# Patient Record
Sex: Male | Born: 1940 | Race: White | Hispanic: No | Marital: Single | Smoking: Former smoker
Health system: Southern US, Community
[De-identification: ages and names within clinical notes are randomized; demographics above are authoritative.]

## PROBLEM LIST (undated history)

## (undated) DIAGNOSIS — C61 Malignant neoplasm of prostate: Secondary | ICD-10-CM

## (undated) DIAGNOSIS — K5730 Diverticulosis of large intestine without perforation or abscess without bleeding: Secondary | ICD-10-CM

## (undated) DIAGNOSIS — I10 Essential (primary) hypertension: Secondary | ICD-10-CM

## (undated) DIAGNOSIS — N138 Other obstructive and reflux uropathy: Secondary | ICD-10-CM

## (undated) DIAGNOSIS — M1990 Unspecified osteoarthritis, unspecified site: Secondary | ICD-10-CM

## (undated) DIAGNOSIS — N401 Enlarged prostate with lower urinary tract symptoms: Secondary | ICD-10-CM

## (undated) HISTORY — PX: TONSILLECTOMY: SUR1361

## (undated) HISTORY — PX: PROSTATE BIOPSY: SHX241

---

## 1964-04-05 HISTORY — PX: OTHER SURGICAL HISTORY: SHX169

## 2006-03-08 ENCOUNTER — Ambulatory Visit: Payer: Self-pay | Admitting: Gastroenterology

## 2006-04-08 ENCOUNTER — Ambulatory Visit: Payer: Self-pay | Admitting: Gastroenterology

## 2006-04-08 HISTORY — PX: COLONOSCOPY: SHX174

## 2010-08-07 ENCOUNTER — Encounter (HOSPITAL_COMMUNITY)
Admission: RE | Admit: 2010-08-07 | Discharge: 2010-08-07 | Disposition: A | Discharge status: Home / Self Care | Payer: Medicare Other | Source: Ambulatory Visit | Attending: Orthopedic Surgery | Admitting: Orthopedic Surgery

## 2010-08-07 ENCOUNTER — Other Ambulatory Visit (HOSPITAL_COMMUNITY): Payer: Self-pay | Admitting: Orthopedic Surgery

## 2010-08-07 ENCOUNTER — Ambulatory Visit (HOSPITAL_COMMUNITY)
Admission: RE | Admit: 2010-08-07 | Discharge: 2010-08-07 | Disposition: A | Discharge status: Home / Self Care | Payer: Medicare Other | Source: Ambulatory Visit | Attending: Orthopedic Surgery | Admitting: Orthopedic Surgery

## 2010-08-07 DIAGNOSIS — M1712 Unilateral primary osteoarthritis, left knee: Secondary | ICD-10-CM

## 2010-08-07 DIAGNOSIS — Z01818 Encounter for other preprocedural examination: Secondary | ICD-10-CM | POA: Insufficient documentation

## 2010-08-07 DIAGNOSIS — J449 Chronic obstructive pulmonary disease, unspecified: Secondary | ICD-10-CM | POA: Insufficient documentation

## 2010-08-07 DIAGNOSIS — Z01812 Encounter for preprocedural laboratory examination: Secondary | ICD-10-CM | POA: Insufficient documentation

## 2010-08-07 DIAGNOSIS — J4489 Other specified chronic obstructive pulmonary disease: Secondary | ICD-10-CM | POA: Insufficient documentation

## 2010-08-07 DIAGNOSIS — Z01810 Encounter for preprocedural cardiovascular examination: Secondary | ICD-10-CM | POA: Insufficient documentation

## 2010-08-07 LAB — CBC
MCHC: 34.9 g/dL (ref 30.0–36.0)
Platelets: 310 10*3/uL (ref 150–400)
RDW: 14 % (ref 11.5–15.5)

## 2010-08-07 LAB — PROTIME-INR: Prothrombin Time: 12.8 seconds (ref 11.6–15.2)

## 2010-08-07 LAB — COMPREHENSIVE METABOLIC PANEL
Alkaline Phosphatase: 67 U/L (ref 39–117)
BUN: 11 mg/dL (ref 6–23)
CO2: 30 mEq/L (ref 19–32)
Chloride: 100 mEq/L (ref 96–112)
Creatinine, Ser: 0.54 mg/dL (ref 0.4–1.5)
GFR calc non Af Amer: >60 mL/min (ref >60)
Potassium: 3.9 mEq/L (ref 3.5–5.1)
Total Bilirubin: 1 mg/dL (ref 0.3–1.2)

## 2010-08-07 LAB — URINALYSIS, ROUTINE W REFLEX MICROSCOPIC
Protein, ur: NEGATIVE mg/dL
Urobilinogen, UA: 0.2 mg/dL (ref 0.0–1.0)

## 2010-08-07 LAB — TYPE AND SCREEN: ABO/RH(D): A POS

## 2010-08-07 LAB — SURGICAL PCR SCREEN: MRSA, PCR: NEGATIVE

## 2010-08-12 ENCOUNTER — Inpatient Hospital Stay (HOSPITAL_COMMUNITY)
Admission: RE | Admit: 2010-08-12 | Discharge: 2010-08-14 | DRG: 470 | Disposition: A | Discharge status: Home Health | Payer: Medicare Other | Source: Ambulatory Visit | Attending: Orthopedic Surgery | Admitting: Orthopedic Surgery

## 2010-08-12 ENCOUNTER — Inpatient Hospital Stay (HOSPITAL_COMMUNITY): Payer: Medicare Other

## 2010-08-12 DIAGNOSIS — M1710 Unilateral primary osteoarthritis, unspecified knee: Principal | ICD-10-CM | POA: Diagnosis present

## 2010-08-12 DIAGNOSIS — IMO0002 Reserved for concepts with insufficient information to code with codable children: Principal | ICD-10-CM | POA: Diagnosis present

## 2010-08-12 DIAGNOSIS — I10 Essential (primary) hypertension: Secondary | ICD-10-CM | POA: Diagnosis present

## 2010-08-12 DIAGNOSIS — F17200 Nicotine dependence, unspecified, uncomplicated: Secondary | ICD-10-CM | POA: Diagnosis present

## 2010-08-12 HISTORY — PX: TOTAL KNEE ARTHROPLASTY: SHX125

## 2010-08-13 LAB — CBC
HCT: 33.7 % — ABNORMAL LOW (ref 39.0–52.0)
Hemoglobin: 11.4 g/dL — ABNORMAL LOW (ref 13.0–17.0)
MCHC: 33.8 g/dL (ref 30.0–36.0)
MCV: 87.8 fL (ref 78.0–100.0)
RDW: 13.7 % (ref 11.5–15.5)
WBC: 11.5 10*3/uL — ABNORMAL HIGH (ref 4.0–10.5)

## 2010-08-13 LAB — PROTIME-INR: INR: 1.01 (ref 0.00–1.49)

## 2010-08-13 LAB — BASIC METABOLIC PANEL
CO2: 28 mEq/L (ref 19–32)
Chloride: 104 mEq/L (ref 96–112)
Creatinine, Ser: 0.53 mg/dL (ref 0.4–1.5)
GFR calc Af Amer: >60 mL/min (ref >60)
Potassium: 3.8 mEq/L (ref 3.5–5.1)

## 2010-08-14 LAB — CBC
HCT: 32.1 % — ABNORMAL LOW (ref 39.0–52.0)
MCH: 30.1 pg (ref 26.0–34.0)
MCV: 87.7 fL (ref 78.0–100.0)
Platelets: 274 10*3/uL (ref 150–400)
RBC: 3.66 MIL/uL — ABNORMAL LOW (ref 4.22–5.81)
WBC: 9.1 10*3/uL (ref 4.0–10.5)

## 2010-08-14 LAB — BASIC METABOLIC PANEL WITH GFR
BUN: 9 mg/dL (ref 6–23)
CO2: 32 meq/L (ref 19–32)
Calcium: 8.7 mg/dL (ref 8.4–10.5)
Chloride: 99 meq/L (ref 96–112)
Creatinine, Ser: 0.5 mg/dL (ref 0.4–1.5)
GFR calc non Af Amer: >60 mL/min
Glucose, Bld: 100 mg/dL — ABNORMAL HIGH (ref 70–99)
Potassium: 3.5 meq/L (ref 3.5–5.1)
Sodium: 137 meq/L (ref 135–145)

## 2010-08-14 LAB — PROTIME-INR
INR: 1.82 — ABNORMAL HIGH (ref 0.00–1.49)
Prothrombin Time: 21.2 s — ABNORMAL HIGH (ref 11.6–15.2)

## 2010-08-30 NOTE — Op Note (Signed)
NAME:  [facility], ED                    ACCOUNT NO.:  [id number]  MEDICAL RECORD NO.:  [id number]           PATIENT TYPE:  I  LOCATION:  5001                         FACILITY:  [facility]  PHYSICIAN:  Loreta Ave, M.D. DATE OF BIRTH:  [birth year]  DATE OF PROCEDURE:  08/12/2010 DATE OF DISCHARGE:                              OPERATIVE REPORT   PREOPERATIVE DIAGNOSIS:  End-stage degenerative arthritis of left knee, varus alignment.  POSTOPERATIVE DIAGNOSIS:  End-stage degenerative arthritis of left knee varus alignment.  PROCEDURE:  Left total knee replacement modified minimally invasive with Stryker triathlon prosthesis.  Soft tissue balancing, medial capsule release.  Cemented pegged posterior stabilized #6 femoral component. Cemented #6 tibial component 11-mm insert.  Resurfacing cemented 38-mm patellar component.  SURGEON:  Loreta Ave, MD  ASSISTANT:  Genene Churn. Barry Dienes, [place], present throughout the entire case and necessary for timely completion of the procedure.  ANESTHESIA:  General.  BLOOD LOSS:  Minimal.  SPECIMENS:  None.  CULTURES:  None.  COMPLICATIONS:  None.  DRESSINGS:  Soft compressive knee immobilizer.  DRAIN:  Hemovac x1.  TOURNIQUET TIME:  One hour ten minutes.  PROCEDURE:  The patient was brought to the operating room, placed on the operative table in supine position.  After adequate anesthesia had been obtained, knee was examined.  Varus alignment, partially correctable, still full extension, good flexion.  Tourniquet applied, prepped and draped in usual sterile fashion.  Exsanguinated with elevation and Esmarch, tourniquet inflated to 350 mmHg.  Straight incision above the patella down to tibial tubercle.  Medial arthrotomy, vastus splitting. Medial capsule release.  Knee exposed.  Grade 4 changes medially with still significant changes in the other compartments.  Remnants of menisci, cruciate ligaments, loose body, spurs removed.   Intramedullary guide on the femur.  A 10-mm resection, 5 degrees of valgus.  Using epicondylar axis, femur was sized, cut, and fitted for posterior stabilized pegged #6 component.  Extramedullary guide on the tibia.  A 3- degree posterior slope cut below the defect medially.  Size #6 component.  Recess examined to be sure all debris cleared throughout. Trials put in place.  A #6 above and below 11-mm insert.  Patella exposed, posterior 10 mm removed and drilled, sized, and fitted for a 38- mm component.  With all trials in place, nicely balanced knee, flexion and extension.  Good alignment, good stability, good tracking confirmed. Tibia was marked for rotation and hand reamed.  All trials were removed. Copious irrigation with pulse irrigating device.  Cement prepared and placed on all components, firmly seated.  Patella held with a clamp. Knee reduced to the polyethylene on the tibia.  Once the cement hardened, I reexamined it.  Again pleased with mechanical axis, alignment, stability, and tracking.  Wound irrigated.  Hemovac placed. Arthrotomy closed with #1 Vicryl.  Skin and subcutaneous tissue with Vicryl and staples.  Sterile compressive dressing applied.  Tourniquet was inflated and removed.  Knee immobilizer applied.  Anesthesia reversed.  Brought to recovery room.  Tolerated the surgery well.  No complications.     Loreta Ave, M.D.  DFM/MEDQ  D:  08/13/2010  T:  08/13/2010  Job:  [id number]  Electronically Signed by Mckinley Jewel M.D. on 08/30/2010 11:37:53 AM

## 2012-01-31 DIAGNOSIS — Z23 Encounter for immunization: Secondary | ICD-10-CM | POA: Diagnosis not present

## 2013-01-12 DIAGNOSIS — Z23 Encounter for immunization: Secondary | ICD-10-CM | POA: Diagnosis not present

## 2014-05-03 DIAGNOSIS — R972 Elevated prostate specific antigen [PSA]: Secondary | ICD-10-CM | POA: Diagnosis not present

## 2014-05-03 DIAGNOSIS — R350 Frequency of micturition: Secondary | ICD-10-CM | POA: Diagnosis not present

## 2014-05-03 DIAGNOSIS — N138 Other obstructive and reflux uropathy: Secondary | ICD-10-CM | POA: Diagnosis not present

## 2014-05-13 DIAGNOSIS — I10 Essential (primary) hypertension: Secondary | ICD-10-CM | POA: Diagnosis not present

## 2014-05-13 DIAGNOSIS — J069 Acute upper respiratory infection, unspecified: Secondary | ICD-10-CM | POA: Diagnosis not present

## 2014-05-13 DIAGNOSIS — N400 Enlarged prostate without lower urinary tract symptoms: Secondary | ICD-10-CM | POA: Diagnosis not present

## 2014-05-21 DIAGNOSIS — R972 Elevated prostate specific antigen [PSA]: Secondary | ICD-10-CM | POA: Diagnosis not present

## 2014-06-05 DIAGNOSIS — N401 Enlarged prostate with lower urinary tract symptoms: Secondary | ICD-10-CM | POA: Diagnosis not present

## 2014-06-05 DIAGNOSIS — R351 Nocturia: Secondary | ICD-10-CM | POA: Diagnosis not present

## 2014-06-05 DIAGNOSIS — R972 Elevated prostate specific antigen [PSA]: Secondary | ICD-10-CM | POA: Diagnosis not present

## 2014-06-05 DIAGNOSIS — R350 Frequency of micturition: Secondary | ICD-10-CM | POA: Diagnosis not present

## 2014-07-08 ENCOUNTER — Ambulatory Visit
Admission: RE | Admit: 2014-07-08 | Discharge: 2014-07-08 | Disposition: A | Discharge status: Home / Self Care | Payer: Medicare Other | Source: Ambulatory Visit | Attending: Geriatric Medicine | Admitting: Geriatric Medicine

## 2014-07-08 ENCOUNTER — Other Ambulatory Visit: Payer: Self-pay | Admitting: Geriatric Medicine

## 2014-07-08 DIAGNOSIS — R05 Cough: Secondary | ICD-10-CM

## 2014-07-08 DIAGNOSIS — I10 Essential (primary) hypertension: Secondary | ICD-10-CM | POA: Diagnosis not present

## 2014-07-08 DIAGNOSIS — J301 Allergic rhinitis due to pollen: Secondary | ICD-10-CM | POA: Diagnosis not present

## 2014-07-08 DIAGNOSIS — H9190 Unspecified hearing loss, unspecified ear: Secondary | ICD-10-CM | POA: Diagnosis not present

## 2014-07-08 DIAGNOSIS — R059 Cough, unspecified: Secondary | ICD-10-CM

## 2014-07-25 DIAGNOSIS — H908 Mixed conductive and sensorineural hearing loss, unspecified: Secondary | ICD-10-CM | POA: Diagnosis not present

## 2014-08-19 DIAGNOSIS — I499 Cardiac arrhythmia, unspecified: Secondary | ICD-10-CM | POA: Diagnosis not present

## 2014-08-19 DIAGNOSIS — Z79899 Other long term (current) drug therapy: Secondary | ICD-10-CM | POA: Diagnosis not present

## 2014-08-19 DIAGNOSIS — I10 Essential (primary) hypertension: Secondary | ICD-10-CM | POA: Diagnosis not present

## 2015-02-07 DIAGNOSIS — Z23 Encounter for immunization: Secondary | ICD-10-CM | POA: Diagnosis not present

## 2015-07-07 DIAGNOSIS — Z0000 Encounter for general adult medical examination without abnormal findings: Secondary | ICD-10-CM | POA: Diagnosis not present

## 2015-07-07 DIAGNOSIS — I10 Essential (primary) hypertension: Secondary | ICD-10-CM | POA: Diagnosis not present

## 2015-07-07 DIAGNOSIS — Z1389 Encounter for screening for other disorder: Secondary | ICD-10-CM | POA: Diagnosis not present

## 2015-07-07 DIAGNOSIS — Z125 Encounter for screening for malignant neoplasm of prostate: Secondary | ICD-10-CM | POA: Diagnosis not present

## 2015-07-07 DIAGNOSIS — Z79899 Other long term (current) drug therapy: Secondary | ICD-10-CM | POA: Diagnosis not present

## 2015-09-22 DIAGNOSIS — R351 Nocturia: Secondary | ICD-10-CM | POA: Diagnosis not present

## 2015-09-22 DIAGNOSIS — R972 Elevated prostate specific antigen [PSA]: Secondary | ICD-10-CM | POA: Diagnosis not present

## 2015-09-22 DIAGNOSIS — N401 Enlarged prostate with lower urinary tract symptoms: Secondary | ICD-10-CM | POA: Diagnosis not present

## 2015-09-23 ENCOUNTER — Other Ambulatory Visit (HOSPITAL_COMMUNITY): Payer: Self-pay | Admitting: Urology

## 2015-09-23 DIAGNOSIS — R972 Elevated prostate specific antigen [PSA]: Secondary | ICD-10-CM

## 2015-09-26 DIAGNOSIS — M1711 Unilateral primary osteoarthritis, right knee: Secondary | ICD-10-CM | POA: Diagnosis not present

## 2015-10-02 DIAGNOSIS — M1711 Unilateral primary osteoarthritis, right knee: Secondary | ICD-10-CM | POA: Diagnosis not present

## 2015-10-10 DIAGNOSIS — M1711 Unilateral primary osteoarthritis, right knee: Secondary | ICD-10-CM | POA: Diagnosis not present

## 2015-10-14 ENCOUNTER — Ambulatory Visit (HOSPITAL_COMMUNITY)
Admission: RE | Admit: 2015-10-14 | Discharge: 2015-10-14 | Disposition: A | Discharge status: Home / Self Care | Payer: Medicare Other | Source: Ambulatory Visit | Attending: Urology | Admitting: Urology

## 2015-10-14 DIAGNOSIS — R972 Elevated prostate specific antigen [PSA]: Secondary | ICD-10-CM | POA: Diagnosis not present

## 2015-10-14 DIAGNOSIS — R938 Abnormal findings on diagnostic imaging of other specified body structures: Secondary | ICD-10-CM | POA: Diagnosis not present

## 2015-10-14 LAB — POCT I-STAT CREATININE: CREATININE: 0.6 mg/dL — AB (ref 0.61–1.24)

## 2015-10-14 MED ORDER — GADOBENATE DIMEGLUMINE 529 MG/ML IV SOLN
15.0000 mL | Freq: Once | INTRAVENOUS | Status: AC | PRN
  Administered 2015-10-14: 15 mL via INTRAVENOUS

## 2015-11-12 DIAGNOSIS — R972 Elevated prostate specific antigen [PSA]: Secondary | ICD-10-CM | POA: Diagnosis not present

## 2015-11-12 DIAGNOSIS — C61 Malignant neoplasm of prostate: Secondary | ICD-10-CM | POA: Diagnosis not present

## 2015-11-18 DIAGNOSIS — C61 Malignant neoplasm of prostate: Secondary | ICD-10-CM | POA: Diagnosis not present

## 2015-12-10 ENCOUNTER — Encounter: Payer: Self-pay | Admitting: Radiation Oncology

## 2015-12-10 NOTE — Progress Notes (Signed)
GU Location of Tumor / Histology: prostatic adenocarcinoma  If Prostate Cancer, Gleason Score is (3 + 3) and PSA is (7.34)  Andrew Day    Biopsies of prostate (if applicable) revealed:    Past/Anticipated interventions by urology, if any: prostate biopsy, referral to radiation oncology to discuss I-125 seed therapy   Past/Anticipated interventions by medical oncology, if any: no  Weight changes, if any: no  Bowel/Bladder complaints, if any: nocturia, frequency, and intermittent stream   Nausea/Vomiting, if any: no  Pain issues, if any:  no  SAFETY ISSUES:  Prior radiation? no  Pacemaker/ICD? no  Possible current pregnancy? no  Is the patient on methotrexate? no  Current Complaints / other details:  75 year old male. Married with two sons and one daughter. Retired. Patient most interested in seeds. Prostate volume 58.09 cc.

## 2015-12-15 ENCOUNTER — Ambulatory Visit
Admission: RE | Admit: 2015-12-15 | Discharge: 2015-12-15 | Disposition: A | Discharge status: Home / Self Care | Payer: Medicare Other | Source: Ambulatory Visit | Attending: Radiation Oncology | Admitting: Radiation Oncology

## 2015-12-15 ENCOUNTER — Encounter: Payer: Self-pay | Admitting: Radiation Oncology

## 2015-12-15 DIAGNOSIS — C61 Malignant neoplasm of prostate: Secondary | ICD-10-CM

## 2015-12-15 HISTORY — DX: Malignant neoplasm of prostate: C61

## 2015-12-15 HISTORY — DX: Essential (primary) hypertension: I10

## 2015-12-15 NOTE — Progress Notes (Signed)
See progress note under physician encounter. 

## 2015-12-15 NOTE — Progress Notes (Signed)
Radiation Oncology         [phone] ________________________________  Initial Outpatient Consultation  Name: Andrew Day MRN: [id number]  Date: 12/15/2015  DOB: 1940-08-11  [id number] Marcello Moores, MD  Carolan Clines, MD   REFERRING PHYSICIAN: Carolan Clines, MD  DIAGNOSIS: The encounter diagnosis was Malignant neoplasm of prostate [facility]).    ICD-9-CM ICD-10-CM   1. Malignant neoplasm of prostate ([place]) 185 C61 PSA   75 y.o. gentleman with T1c adenocarcinoma of the prostate, Gleason Score 3+3 and PSA 7.34  HISTORY OF PRESENT ILLNESS: Andrew Day is a 75 y.o. male seen at the request of Dr. Gaynelle Arabian for a new diagnosis of prostate cancer. He was noted to have an elevated PSA by his primary care physician, Dr. Felipa Eth.  Accordingly, he was referred for evaluation in urology by Dr. Gaynelle Arabian,  digital rectal examination was performed at that time revealing no palpable nodules. He has been followed because of an elevated PSA in 2016. On 05/21/2014, the patient had a 12 core biopsy of the prostate revealing high-grade PIN in the left mid lateral. The patient continued to have persistent PSA elevation at 7.34 and proceeded to transrectal ultrasound with 12 biopsies of the prostate on 11/12/15.  The prostate volume measured 58 cc.  Out of 12 core biopsies, one was positive for adenocarcinoma.  The maximum Gleason score was 3+3, and this was seen in the right mid apex.  PSA History 12/2013: 4.29 02/2014: 4.44 05/2014: 8.96    The patient reviewed the biopsy results with his urologist and he has kindly been referred today for discussion of potential radiation treatment options. Also present during this encounter was the patient's wife.  PREVIOUS RADIATION THERAPY: No  PAST MEDICAL HISTORY:  Past Medical History:  Diagnosis Date  . Hypertension   . Prostate cancer ([place])       PAST SURGICAL HISTORY: Past Surgical History:  Procedure Laterality Date  . KNEE SURGERY    .  PROSTATE BIOPSY      FAMILY HISTORY:  Family History  Problem Relation Age of Onset  . Diabetes Father   . Cancer Daughter     sinus    SOCIAL HISTORY:  Social History   Social History  . Marital status: Single    Spouse name: N/A  . Number of children: N/A  . Years of education: N/A   Occupational History  . Not on file.   Social History Main Topics  . Smoking status: Light Tobacco Smoker    Packs/day: 0.00    Years: 5.00    Types: Cigars  . Smokeless tobacco: Never Used  . Alcohol use 3.0 oz/week    5 Glasses of wine per week     Comment: glass of wine each night  . Drug use: No  . Sexual activity: Yes   Other Topics Concern  . Not on file   Social History Narrative  . No narrative on file    ALLERGIES: Review of patient's allergies indicates no known allergies.  MEDICATIONS:  Current Outpatient Prescriptions  Medication Sig Dispense Refill  . AMLODIPINE BESYLATE PO Take 10 mg by mouth.    . Ascorbic Acid (VITAMIN C) 1000 MG tablet Take 1,000 mg by mouth daily.    Marland Kitchen aspirin 81 MG chewable tablet Chew by mouth daily.    . Calcium Carbonate-Vit D-Min (CALCIUM 1200 PO) Take by mouth.    . Cholecalciferol (VITAMIN D3) 1000 units CAPS Take by mouth.    . hydrochlorothiazide (HYDRODIURIL) 25 MG tablet  Take 25 mg by mouth daily.    Marland Kitchen lisinopril (PRINIVIL,ZESTRIL) 10 MG tablet Take 10 mg by mouth daily.    . Multiple Vitamins-Minerals (MULTIVITAMIN ADULT PO) Take by mouth.    . Omega-3 Fatty Acids (FISH OIL) 1000 MG CAPS Take by mouth. Every other day     No current facility-administered medications for this encounter.     REVIEW OF SYSTEMS:  On review of systems, the patient reports that he is doing well overall. He denies any chest pain, shortness of breath, cough, fevers, chills, night sweats, unintended weight changes. He denies any bowel disturbances, and denies abdominal pain, nausea or vomiting. He denies any new musculoskeletal or joint aches or pains.  IPSS: 9, indicating mild bladder symptoms. Symptoms include, incomplete emptying, frequency, urgency, and a weak stream. Moderate erectile dysfunction with a SHIM Score of 17. A complete review of systems is obtained and is otherwise negative.    PHYSICAL EXAM:  height is 5\' 11"  (1.803 m) and weight is 165 lb 8 oz (75.1 kg). His oral temperature is 98.1 F (36.7 C). His blood pressure is 120/74 and his pulse is 72. His respiration is 18 and oxygen saturation is 100%.   Pain Scale 0/10 In general this is a well appearing Caucasian male in no acute distress. He is alert and oriented x4 and appropriate throughout the examination. HEENT reveals that the patient is normocephalic, atraumatic. EOMs are intact. PERRLA. Skin is intact without any evidence of gross lesions. Cardiovascular exam reveals a regular rate and rhythm, no clicks rubs or murmurs are auscultated. Chest is clear to auscultation bilaterally. Lymphatic assessment is performed and does not reveal any adenopathy in the cervical, supraclavicular, axillary, or inguinal chains. Abdomen has active bowel sounds in all quadrants and is intact. The abdomen is soft, non tender, non distended. Lower extremities are negative for pretibial pitting edema, deep calf tenderness, cyanosis or clubbing.   KPS = 100  100 - Normal; no complaints; no evidence of disease. 90   - Able to carry on normal activity; minor signs or symptoms of disease. 80   - Normal activity with effort; some signs or symptoms of disease. 69   - Cares for self; unable to carry on normal activity or to do active work. 60   - Requires occasional assistance, but is able to care for most of his personal needs. 50   - Requires considerable assistance and frequent medical care. 99   - Disabled; requires special care and assistance. 101   - Severely disabled; hospital admission is indicated although death not imminent. 41   - Very sick; hospital admission necessary; active supportive  treatment necessary. 10   - Moribund; fatal processes progressing rapidly. 0     - Dead  Karnofsky DA, Abelmann [place], Craver LS and Burchenal [facility] [phone]) The use of the nitrogen mustards in the palliative treatment of carcinoma: with particular reference to bronchogenic carcinoma Cancer 1 634-56  LABORATORY DATA:  Lab Results  Component Value Date   WBC 9.1 08/14/2010   HGB 11.0 (L) 08/14/2010   HCT 32.1 (L) 08/14/2010   MCV 87.7 08/14/2010   PLT 274 08/14/2010   Lab Results  Component Value Date   NA 137 08/14/2010   K 3.5 08/14/2010   CL 99 08/14/2010   CO2 32 08/14/2010   Lab Results  Component Value Date   ALT 17 08/07/2010   AST 18 08/07/2010   ALKPHOS 67 08/07/2010   BILITOT 1.0 08/07/2010  RADIOGRAPHY: No results found.    IMPRESSION/PLAN: 1. 76 y.o. gentleman with a low risk, T1c, adenocarcinoma of the prostate with maximum Gleason's Score of 3+3 in 1 core and a PSA of 7.34.  Accordingly he is eligible for a variety of potential treatment options including surveillance, prostatectomy, IMRT, or radioactive seed implant.  Today I reviewed the findings and workup thus far.  We discussed the natural history of prostate cancer.  We reviewed the the implications of T-stage, Gleason's Score, and PSA on decision-making and outcomes in prostate cancer.  We discussed radiation treatment in the management of prostate cancer with regard to the logistics and delivery of external beam radiation treatment as well as the logistics and delivery of prostate brachytherapy.  We compared and contrasted each of these approaches and also compared these against prostatectomy.  The patient expressed interest in prostate brachytherapy.  The patient would like to proceed with prostate brachytherapy.  I will share my findings with Dr. Gaynelle Arabian and move forward with scheduling the procedure in the near future.  Dr. Tammi Klippel discussed rechecking the patient's PSA as it has been more than 6  months.  I enjoyed meeting with him today, and will look forward to participating in the care of this very nice gentleman.  The above documentation reflects my direct findings during this shared patient visit. Please see the separate note by Dr. Tammi Klippel on this date for the remainder of the patient's plan of care.   Carola Rhine, PAC  This document serves as a record of services personally performed by Shona Simpson, PA-C and Dr. Tyler Pita, MD. It was created on their behalf by Darcus Austin, a trained medical scribe. The creation of this record is based on the scribe's personal observations and the providers' statements to them. This document has been checked and approved by the attending provider.      I have personally performed a face to face diagnostic evaluation on this patient and devised the assessment and plan.    Tyler Pita, MD [place] Director and Director of Stereotactic Radiosurgery Direct Dial: [phone]  Fax: [phone] [email or web address]  Skype  [email or web address]

## 2015-12-16 LAB — PSA: Prostate Specific Ag, Serum: 8.2 ng/mL — ABNORMAL HIGH (ref 0.0–4.0)

## 2015-12-17 ENCOUNTER — Telehealth: Payer: Self-pay | Admitting: Radiation Oncology

## 2015-12-17 NOTE — Telephone Encounter (Signed)
Phoned patient to inform him of his PSA results. Patient reports Dr. Johny Shears has already called. Patient aware PSA is 8.2 but, expressed appreciation for the call.

## 2015-12-17 NOTE — Telephone Encounter (Signed)
-----   Message from Hayden Pedro, [place] sent at 12/16/2015 12:14 PM EDT ----- Regarding: FW: PSA result Tonie- Would you mind letting the pt know his PSA was 8.2, and we will continue just as outlined with plans for his seed implant? Enid Derry will coordinate next week.  Thanks, Bryson Ha  ----- Message ----- From: Hollace Kinnier Sent: 12/16/2015  11:59 AM To: Hayden Pedro, PA-C Subject: FW: PSA result                                 I was asked to forward this to you on behalf of Sam. Thank you!-Lori ----- Message ----- From: Hollace Kinnier Sent: 12/16/2015  10:47 AM To: Heywood Footman, RN Subject: PSA result                                     Hi Sam,  Mr. Demont said that he doesn't see his PSA result on 'MyChart' and was really wanting to know what it turned out to be from yesterday's blood work. Would you be able to give him a call in regards to this result? [phone].  Thank you,  Cecille Rubin

## 2015-12-18 ENCOUNTER — Encounter: Payer: Self-pay | Admitting: *Deleted

## 2015-12-18 NOTE — Progress Notes (Signed)
  Radiation Oncology         [phone] ________________________________  Name: Andrew Day MRN: [id number]  Date: 12/19/2015  DOB: [birth year]  SIMULATION AND TREATMENT PLANNING NOTE PUBIC ARCH STUDY  [id number] Marcello Moores, MD  Carolan Clines, MD  DIAGNOSIS: 75 y.o. 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 Malignant neoplasm of prostate ([place]) [place]:  The patient presented today for evaluation for possible prostate seed implant. He was brought to the radiation planning suite and placed supine on the CT couch. A 3-dimensional image study set was obtained in upload to the planning computer. There, on each axial slice, I contoured the prostate gland. Then, using three-dimensional radiation planning tools I reconstructed the prostate in view of the structures from the transperineal needle pathway to assess for possible pubic arch interference. In doing so, I did not appreciate any pubic arch interference. Also, the patient's prostate volume was estimated based on the drawn structure. The volume was 58 cc.  Given the pubic arch appearance and prostate volume, patient remains a good candidate to proceed with prostate seed implant. Today, he freely provided informed written consent to proceed.    PLAN: The patient will undergo prostate seed implant.   ________________________________  Sheral Apley. Tammi Klippel, M.D.

## 2015-12-18 NOTE — Progress Notes (Signed)
Spoke with Andrew Day and gave him his PSA results and let him know he will continue with his seed implant plans as discussed with Dr. Tammi Klippel.

## 2015-12-19 ENCOUNTER — Ambulatory Visit
Admission: RE | Admit: 2015-12-19 | Discharge: 2015-12-19 | Disposition: A | Discharge status: Home / Self Care | Payer: Medicare Other | Source: Ambulatory Visit | Attending: Radiation Oncology | Admitting: Radiation Oncology

## 2015-12-19 DIAGNOSIS — C61 Malignant neoplasm of prostate: Secondary | ICD-10-CM | POA: Diagnosis not present

## 2015-12-22 ENCOUNTER — Telehealth: Payer: Self-pay | Admitting: *Deleted

## 2015-12-22 NOTE — Telephone Encounter (Signed)
Called patient to inform that I called [facility] to get him an appt. For gold seed placement, the scheduler that I spoke with told me that she needs to contact his nurse because Dr. Gaynelle Arabian is booked out, I spoke with Mr. Nicholes and I informed him of this, I told him as soon as they call me that I will call him

## 2015-12-26 ENCOUNTER — Other Ambulatory Visit: Payer: Self-pay | Admitting: Urology

## 2015-12-26 ENCOUNTER — Telehealth: Payer: Self-pay | Admitting: *Deleted

## 2015-12-26 NOTE — Telephone Encounter (Signed)
Called patient to inform of implant date, spoke with patient and he is aware of this implant date

## 2015-12-29 ENCOUNTER — Telehealth: Payer: Self-pay | Admitting: *Deleted

## 2015-12-29 NOTE — Telephone Encounter (Signed)
CALLED PATIENT TO ASK ABOUT GETTING HIS CHEST X-RAY AND EKG, LVM FOR A RETURN CALL

## 2015-12-30 ENCOUNTER — Encounter (HOSPITAL_BASED_OUTPATIENT_CLINIC_OR_DEPARTMENT_OTHER)
Admission: RE | Admit: 2015-12-30 | Discharge: 2015-12-30 | Disposition: A | Discharge status: Home / Self Care | Payer: Medicare Other | Source: Ambulatory Visit | Attending: Urology | Admitting: Urology

## 2015-12-30 ENCOUNTER — Ambulatory Visit (HOSPITAL_BASED_OUTPATIENT_CLINIC_OR_DEPARTMENT_OTHER)
Admission: RE | Admit: 2015-12-30 | Discharge: 2015-12-30 | Disposition: A | Discharge status: Home / Self Care | Payer: Medicare Other | Source: Ambulatory Visit | Attending: Urology | Admitting: Urology

## 2015-12-30 DIAGNOSIS — C61 Malignant neoplasm of prostate: Secondary | ICD-10-CM | POA: Diagnosis not present

## 2015-12-30 DIAGNOSIS — M47819 Spondylosis without myelopathy or radiculopathy, site unspecified: Secondary | ICD-10-CM | POA: Diagnosis not present

## 2016-01-05 DIAGNOSIS — N400 Enlarged prostate without lower urinary tract symptoms: Secondary | ICD-10-CM | POA: Diagnosis not present

## 2016-01-05 DIAGNOSIS — I10 Essential (primary) hypertension: Secondary | ICD-10-CM | POA: Diagnosis not present

## 2016-01-28 ENCOUNTER — Telehealth: Payer: Self-pay | Admitting: *Deleted

## 2016-01-28 ENCOUNTER — Ambulatory Visit: Admission: RE | Admit: 2016-01-28 | Payer: Medicare Other | Source: Ambulatory Visit

## 2016-01-28 NOTE — Telephone Encounter (Signed)
Called patient to remind of labs for 01-29-16 for implant on 02-05-16, lvm for a return call

## 2016-02-02 ENCOUNTER — Encounter (HOSPITAL_BASED_OUTPATIENT_CLINIC_OR_DEPARTMENT_OTHER): Payer: Self-pay | Admitting: *Deleted

## 2016-02-02 DIAGNOSIS — Z87891 Personal history of nicotine dependence: Secondary | ICD-10-CM | POA: Diagnosis not present

## 2016-02-02 DIAGNOSIS — N401 Enlarged prostate with lower urinary tract symptoms: Secondary | ICD-10-CM | POA: Diagnosis not present

## 2016-02-02 DIAGNOSIS — M1990 Unspecified osteoarthritis, unspecified site: Secondary | ICD-10-CM | POA: Diagnosis not present

## 2016-02-02 DIAGNOSIS — Z7982 Long term (current) use of aspirin: Secondary | ICD-10-CM | POA: Diagnosis not present

## 2016-02-02 DIAGNOSIS — C61 Malignant neoplasm of prostate: Secondary | ICD-10-CM | POA: Diagnosis not present

## 2016-02-02 DIAGNOSIS — I10 Essential (primary) hypertension: Secondary | ICD-10-CM | POA: Diagnosis not present

## 2016-02-02 DIAGNOSIS — N138 Other obstructive and reflux uropathy: Secondary | ICD-10-CM | POA: Diagnosis not present

## 2016-02-02 LAB — PROTIME-INR
INR: 0.96
Prothrombin Time: 12.8 seconds (ref 11.4–15.2)

## 2016-02-02 LAB — COMPREHENSIVE METABOLIC PANEL
ALBUMIN: 4.3 g/dL (ref 3.5–5.0)
ALK PHOS: 73 U/L (ref 38–126)
ALT: 18 U/L (ref 17–63)
ANION GAP: 7 (ref 5–15)
AST: 20 U/L (ref 15–41)
BUN: 15 mg/dL (ref 6–20)
CALCIUM: 9.2 mg/dL (ref 8.9–10.3)
CO2: 25 mmol/L (ref 22–32)
Chloride: 104 mmol/L (ref 101–111)
Creatinine, Ser: 0.64 mg/dL (ref 0.61–1.24)
GFR calc non Af Amer: >60 mL/min (ref >60)
GLUCOSE: 87 mg/dL (ref 65–99)
POTASSIUM: 4 mmol/L (ref 3.5–5.1)
SODIUM: 136 mmol/L (ref 135–145)
Total Bilirubin: 1.1 mg/dL (ref 0.3–1.2)
Total Protein: 7 g/dL (ref 6.5–8.1)

## 2016-02-02 LAB — APTT: APTT: 26 s (ref 24–36)

## 2016-02-02 LAB — CBC
HCT: 41 % (ref 39.0–52.0)
HEMOGLOBIN: 14 g/dL (ref 13.0–17.0)
MCH: 29.4 pg (ref 26.0–34.0)
MCHC: 34.1 g/dL (ref 30.0–36.0)
MCV: 86.1 fL (ref 78.0–100.0)
PLATELETS: 291 10*3/uL (ref 150–400)
RBC: 4.76 MIL/uL (ref 4.22–5.81)
RDW: 13.9 % (ref 11.5–15.5)
WBC: 7.2 10*3/uL (ref 4.0–10.5)

## 2016-02-02 NOTE — Progress Notes (Signed)
NPO AFTER MN.  ARRIVE AT 0800.  CURRENT LAB RESULTS, CXR, AND EKG IN CHART AND EPIC.  WILL TAKE NORVASC AM DOS W/ SIPS OF WATER AND DO FLEET ENEMA.

## 2016-02-02 NOTE — Progress Notes (Signed)
NPO AFTER MN.  ARRIVE AT 0800.

## 2016-02-04 ENCOUNTER — Telehealth: Payer: Self-pay | Admitting: *Deleted

## 2016-02-04 NOTE — Telephone Encounter (Signed)
CALLED PATIENT TO REMIND OF IMPLANT OF FOR 02-05-16, SPOKE WITH PATIENT AND HE IS AWARE OF THIS IMPLANT

## 2016-02-05 ENCOUNTER — Ambulatory Visit (HOSPITAL_COMMUNITY): Payer: Medicare Other

## 2016-02-05 ENCOUNTER — Ambulatory Visit (HOSPITAL_BASED_OUTPATIENT_CLINIC_OR_DEPARTMENT_OTHER)
Admission: RE | Admit: 2016-02-05 | Discharge: 2016-02-05 | Disposition: A | Discharge status: Home / Self Care | Payer: Medicare Other | Source: Ambulatory Visit | Attending: Urology | Admitting: Urology

## 2016-02-05 ENCOUNTER — Encounter (HOSPITAL_BASED_OUTPATIENT_CLINIC_OR_DEPARTMENT_OTHER)
Admission: RE | Disposition: A | Discharge status: Home / Self Care | Payer: Self-pay | Source: Ambulatory Visit | Attending: Urology

## 2016-02-05 ENCOUNTER — Ambulatory Visit (HOSPITAL_BASED_OUTPATIENT_CLINIC_OR_DEPARTMENT_OTHER): Payer: Medicare Other | Admitting: Anesthesiology

## 2016-02-05 ENCOUNTER — Encounter (HOSPITAL_BASED_OUTPATIENT_CLINIC_OR_DEPARTMENT_OTHER): Payer: Self-pay | Admitting: Anesthesiology

## 2016-02-05 DIAGNOSIS — N138 Other obstructive and reflux uropathy: Secondary | ICD-10-CM | POA: Diagnosis not present

## 2016-02-05 DIAGNOSIS — I10 Essential (primary) hypertension: Secondary | ICD-10-CM | POA: Insufficient documentation

## 2016-02-05 DIAGNOSIS — N401 Enlarged prostate with lower urinary tract symptoms: Secondary | ICD-10-CM | POA: Diagnosis not present

## 2016-02-05 DIAGNOSIS — Z87891 Personal history of nicotine dependence: Secondary | ICD-10-CM | POA: Insufficient documentation

## 2016-02-05 DIAGNOSIS — Z7982 Long term (current) use of aspirin: Secondary | ICD-10-CM | POA: Diagnosis not present

## 2016-02-05 DIAGNOSIS — M1990 Unspecified osteoarthritis, unspecified site: Secondary | ICD-10-CM | POA: Insufficient documentation

## 2016-02-05 DIAGNOSIS — C61 Malignant neoplasm of prostate: Secondary | ICD-10-CM | POA: Diagnosis not present

## 2016-02-05 HISTORY — DX: Diverticulosis of large intestine without perforation or abscess without bleeding: K57.30

## 2016-02-05 HISTORY — PX: RADIOACTIVE SEED IMPLANT: SHX5150

## 2016-02-05 HISTORY — DX: Benign prostatic hyperplasia with lower urinary tract symptoms: N13.8

## 2016-02-05 HISTORY — DX: Benign prostatic hyperplasia with lower urinary tract symptoms: N40.1

## 2016-02-05 HISTORY — DX: Unspecified osteoarthritis, unspecified site: M19.90

## 2016-02-05 SURGERY — INSERTION, RADIATION SOURCE, PROSTATE
Anesthesia: General | Site: Prostate

## 2016-02-05 MED ORDER — LIDOCAINE 2% (20 MG/ML) 5 ML SYRINGE
INTRAMUSCULAR | Status: DC | PRN
  Administered 2016-02-05: 40 mg via INTRAVENOUS

## 2016-02-05 MED ORDER — CIPROFLOXACIN IN D5W 400 MG/200ML IV SOLN
INTRAVENOUS | Status: AC
  Filled 2016-02-05: qty 200

## 2016-02-05 MED ORDER — EPHEDRINE SULFATE 50 MG/ML IJ SOLN
INTRAMUSCULAR | Status: DC | PRN
  Administered 2016-02-05: 5 mg via INTRAVENOUS
  Administered 2016-02-05 (×2): 10 mg via INTRAVENOUS

## 2016-02-05 MED ORDER — SODIUM CHLORIDE 0.9 % IR SOLN
Status: DC | PRN
  Administered 2016-02-05: 1000 mL via INTRAVESICAL

## 2016-02-05 MED ORDER — KETOROLAC TROMETHAMINE 30 MG/ML IJ SOLN
INTRAMUSCULAR | Status: AC
  Filled 2016-02-05: qty 1

## 2016-02-05 MED ORDER — ACETAMINOPHEN 10 MG/ML IV SOLN
INTRAVENOUS | Status: AC
  Filled 2016-02-05: qty 100

## 2016-02-05 MED ORDER — FENTANYL CITRATE (PF) 100 MCG/2ML IJ SOLN
INTRAMUSCULAR | Status: AC
  Filled 2016-02-05: qty 2

## 2016-02-05 MED ORDER — PROPOFOL 10 MG/ML IV BOLUS
INTRAVENOUS | Status: AC
  Filled 2016-02-05: qty 40

## 2016-02-05 MED ORDER — MIDAZOLAM HCL 2 MG/2ML IJ SOLN
INTRAMUSCULAR | Status: AC
  Filled 2016-02-05: qty 2

## 2016-02-05 MED ORDER — OXYCODONE HCL 5 MG PO TABS
5.0000 mg | ORAL_TABLET | Freq: Once | ORAL | Status: DC | PRN
  Filled 2016-02-05: qty 1

## 2016-02-05 MED ORDER — OXYCODONE-ACETAMINOPHEN 5-325 MG PO TABS: 1.0000 | ORAL_TABLET | Freq: Four times a day (QID) | ORAL | 0 refills | Status: DC | PRN

## 2016-02-05 MED ORDER — FLEET ENEMA 7-19 GM/118ML RE ENEM
1.0000 | ENEMA | Freq: Once | RECTAL | Status: DC
  Filled 2016-02-05: qty 1

## 2016-02-05 MED ORDER — ONDANSETRON HCL 4 MG/2ML IJ SOLN
INTRAMUSCULAR | Status: AC
  Filled 2016-02-05: qty 2

## 2016-02-05 MED ORDER — MIDAZOLAM HCL 5 MG/5ML IJ SOLN
INTRAMUSCULAR | Status: DC | PRN
  Administered 2016-02-05: 2 mg via INTRAVENOUS

## 2016-02-05 MED ORDER — DEXAMETHASONE SODIUM PHOSPHATE 10 MG/ML IJ SOLN
INTRAMUSCULAR | Status: DC | PRN
  Administered 2016-02-05: 10 mg via INTRAVENOUS

## 2016-02-05 MED ORDER — FENTANYL CITRATE (PF) 100 MCG/2ML IJ SOLN
INTRAMUSCULAR | Status: DC | PRN
  Administered 2016-02-05 (×4): 25 ug via INTRAVENOUS

## 2016-02-05 MED ORDER — ONDANSETRON HCL 4 MG/2ML IJ SOLN
INTRAMUSCULAR | Status: DC | PRN
  Administered 2016-02-05: 4 mg via INTRAVENOUS

## 2016-02-05 MED ORDER — LACTATED RINGERS IV SOLN
INTRAVENOUS | Status: DC
  Filled 2016-02-05: qty 1000

## 2016-02-05 MED ORDER — LIDOCAINE 2% (20 MG/ML) 5 ML SYRINGE
INTRAMUSCULAR | Status: AC
  Filled 2016-02-05: qty 5

## 2016-02-05 MED ORDER — MEPERIDINE HCL 25 MG/ML IJ SOLN
6.2500 mg | INTRAMUSCULAR | Status: DC | PRN
  Filled 2016-02-05: qty 1

## 2016-02-05 MED ORDER — ACETAMINOPHEN 10 MG/ML IV SOLN
INTRAVENOUS | Status: DC | PRN
  Administered 2016-02-05: 1000 mg via INTRAVENOUS

## 2016-02-05 MED ORDER — KETOROLAC TROMETHAMINE 30 MG/ML IJ SOLN
INTRAMUSCULAR | Status: DC | PRN
  Administered 2016-02-05: 30 mg via INTRAVENOUS

## 2016-02-05 MED ORDER — OXYCODONE HCL 5 MG/5ML PO SOLN
5.0000 mg | Freq: Once | ORAL | Status: DC | PRN
  Filled 2016-02-05: qty 5

## 2016-02-05 MED ORDER — PHENAZOPYRIDINE HCL 200 MG PO TABS: 200.0000 mg | ORAL_TABLET | Freq: Three times a day (TID) | ORAL | 3 refills | Status: AC | PRN

## 2016-02-05 MED ORDER — DEXAMETHASONE SODIUM PHOSPHATE 10 MG/ML IJ SOLN
INTRAMUSCULAR | Status: AC
  Filled 2016-02-05: qty 1

## 2016-02-05 MED ORDER — CIPROFLOXACIN IN D5W 400 MG/200ML IV SOLN
400.0000 mg | INTRAVENOUS | Status: AC
  Administered 2016-02-05 (×2): 400 mg via INTRAVENOUS
  Filled 2016-02-05: qty 200

## 2016-02-05 MED ORDER — PROPOFOL 500 MG/50ML IV EMUL
INTRAVENOUS | Status: DC | PRN
  Administered 2016-02-05: 40 mL via INTRAVENOUS
  Administered 2016-02-05: 160 mL via INTRAVENOUS

## 2016-02-05 MED ORDER — TRIMETHOPRIM 100 MG PO TABS: 100.0000 mg | ORAL_TABLET | Freq: Every day | ORAL | 1 refills | Status: DC

## 2016-02-05 MED ORDER — PROMETHAZINE HCL 25 MG/ML IJ SOLN
6.2500 mg | INTRAMUSCULAR | Status: DC | PRN
  Filled 2016-02-05: qty 1

## 2016-02-05 MED ORDER — MELOXICAM 15 MG PO TABS: 15.0000 mg | ORAL_TABLET | Freq: Every day | ORAL | 0 refills | Status: DC

## 2016-02-05 MED ORDER — LACTATED RINGERS IV SOLN
INTRAVENOUS | Status: DC
  Administered 2016-02-05: 09:00:00 via INTRAVENOUS
  Filled 2016-02-05: qty 1000

## 2016-02-05 MED ORDER — IOHEXOL 300 MG/ML  SOLN
INTRAMUSCULAR | Status: DC | PRN
  Administered 2016-02-05: 7 mL

## 2016-02-05 MED ORDER — FENTANYL CITRATE (PF) 100 MCG/2ML IJ SOLN
25.0000 ug | INTRAMUSCULAR | Status: DC | PRN
  Filled 2016-02-05: qty 1

## 2016-02-05 MED ORDER — HYOSCYAMINE SULFATE SL 0.125 MG SL SUBL: 0.1250 mg | SUBLINGUAL_TABLET | SUBLINGUAL | 0 refills | Status: DC | PRN

## 2016-02-05 MED ORDER — EPHEDRINE 5 MG/ML INJ
INTRAVENOUS | Status: AC
  Filled 2016-02-05: qty 20

## 2016-02-05 SURGICAL SUPPLY — 28 items
BAG URINE DRAINAGE (UROLOGICAL SUPPLIES) ×5 IMPLANT
BLADE CLIPPER SURG (BLADE) ×3 IMPLANT
CATH FOLEY 2WAY SLVR  5CC 16FR (CATHETERS) ×4
CATH FOLEY 2WAY SLVR 5CC 16FR (CATHETERS) ×2 IMPLANT
CATH ROBINSON RED A/P 20FR (CATHETERS) ×3 IMPLANT
CLOTH BEACON ORANGE TIMEOUT ST (SAFETY) ×3 IMPLANT
COVER BACK TABLE 60X90IN (DRAPES) ×3 IMPLANT
COVER MAYO STAND STRL (DRAPES) ×3 IMPLANT
DRSG TEGADERM 4X4.75 (GAUZE/BANDAGES/DRESSINGS) ×3 IMPLANT
DRSG TEGADERM 8X12 (GAUZE/BANDAGES/DRESSINGS) ×3 IMPLANT
GLOVE BIO SURGEON STRL SZ7.5 (GLOVE) ×6 IMPLANT
GLOVE ECLIPSE 8.0 STRL XLNG CF (GLOVE) ×12 IMPLANT
GOWN STRL REUS W/ TWL LRG LVL3 (GOWN DISPOSABLE) ×1 IMPLANT
GOWN STRL REUS W/ TWL XL LVL3 (GOWN DISPOSABLE) ×1 IMPLANT
GOWN STRL REUS W/TWL LRG LVL3 (GOWN DISPOSABLE) ×3
GOWN STRL REUS W/TWL XL LVL3 (GOWN DISPOSABLE) ×3
HOLDER FOLEY CATH W/STRAP (MISCELLANEOUS) ×3 IMPLANT
IV NS 1000ML (IV SOLUTION) ×3
IV NS 1000ML BAXH (IV SOLUTION) ×1 IMPLANT
KIT ROOM TURNOVER WOR (KITS) ×3 IMPLANT
PACK CYSTO (CUSTOM PROCEDURE TRAY) ×3 IMPLANT
SPONGE GAUZE 4X4 12PLY STER LF (GAUZE/BANDAGES/DRESSINGS) ×2 IMPLANT
SYRINGE 10CC LL (SYRINGE) ×5 IMPLANT
SelectSeed I-125 ×170 IMPLANT
TUBE CONNECTING 12'X1/4 (SUCTIONS)
TUBE CONNECTING 12X1/4 (SUCTIONS) IMPLANT
UNDERPAD 30X30 INCONTINENT (UNDERPADS AND DIAPERS) ×6 IMPLANT
WATER STERILE IRR 500ML POUR (IV SOLUTION) ×3 IMPLANT

## 2016-02-05 NOTE — Anesthesia Postprocedure Evaluation (Signed)
Anesthesia Post Note  Patient: Corden Gehling  Procedure(s) Performed: Procedure(s) (LRB): RADIOACTIVE SEED IMPLANT (N/A)  Patient location during evaluation: PACU Anesthesia Type: General Level of consciousness: awake and alert Pain management: pain level controlled Vital Signs Assessment: post-procedure vital signs reviewed and stable Respiratory status: spontaneous breathing, nonlabored ventilation, respiratory function stable and patient connected to nasal cannula oxygen Cardiovascular status: blood pressure returned to baseline and stable Postop Assessment: no signs of nausea or vomiting Anesthetic complications: no    Last Vitals:  Vitals:   02/05/16 1230 02/05/16 1245  BP: 109/63 (!) 106/58  Pulse: 63 63  Resp: 14 14  Temp:      Last Pain:  Vitals:   02/05/16 0821  TempSrc: Oral                 Effie Berkshire

## 2016-02-05 NOTE — Anesthesia Procedure Notes (Signed)
Procedure Name: LMA Insertion Date/Time: 02/05/2016 9:58 AM Performed by: Wanita Chamberlain Pre-anesthesia Checklist: Patient identified, Timeout performed, Emergency Drugs available, Suction available and Patient being monitored Patient Re-evaluated:Patient Re-evaluated prior to inductionOxygen Delivery Method: Circle system utilized Preoxygenation: Pre-oxygenation with 100% oxygen Intubation Type: IV induction Ventilation: Mask ventilation without difficulty LMA: LMA inserted LMA Size: 4.0 Number of attempts: 1 Placement Confirmation: positive ETCO2 and breath sounds checked- equal and bilateral Tube secured with: Tape Dental Injury: Teeth and Oropharynx as per pre-operative assessment

## 2016-02-05 NOTE — H&P (Signed)
Office Visit Report     11/18/2015   --------------------------------------------------------------------------------   Andrew Day  MRN: [id number]  PRIMARY CARE:  Hal T. Felipa Eth, MD  DOB: [birth year], 75 year old Male  REFERRING:  Marella Vanderpol I. Gaynelle Arabian, MD  [id number]  PROVIDER:  Carolan Clines, M.D.    LOCATION:  [facility] [phone]   --------------------------------------------------------------------------------   CC: I have prostate cancer.  HPI: Andrew Day is a 75 year-old male patient who was referred by Dr. Pierre Bali I. Gaynelle Arabian, MD who is here evaluation for treatment of prostate cancer.  His prostate cancer was diagnosed 11/12/2015. He does have the pathology report from his biopsy. His cancer was diagnosed by [facility]. His PSA at his time of diagnosis was 7.34.   He has not undergone surgery for treatment. He has not undergone External Beam Radiation Therapy for treatment. He has not undergone Hormonal Therapy for treatment.   He does not have urinary incontinence. He does not have problems with erectile dysfunction. He has not recently had unwanted weight loss.   Gleason 6 (3+3) in Rt apex involves 20% medial apical biopsies, in 58 gram volume gland.      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, BPH w/LUTS, Benign localized hyperplasia of prostate with urinary obstruction - 06/05/2014 Elevated PSA - 09/22/2015, Elevated PSA, Elevated prostate specific  antigen (PSA) - 06/05/2014 Rising PSA, Post Treatment (Stable) - 09/22/2015 Nocturia, Nocturia - 06/05/2014 Urinary Frequency, Urinary frequency - 06/05/2014    NON-GU PMH: Encounter for general adult medical examination without abnormal findings, Encounter for preventive health examination - 2016 Personal history of other diseases of the circulatory system, History of hypertension - 2016    FAMILY HISTORY: Congestive Heart Failure - Runs In Family Deceased - Runs In Family Diabetes - Runs In Family   SOCIAL HISTORY: Marital Status: Married Current Smoking Status: Patient has never smoked.  Drinks 2 drinks per day. Light Drinker.  Drinks 3 caffeinated drinks per day. Patient's occupation is/was Retired.     Notes: 2 sons, 1 daughter   REVIEW OF SYSTEMS:    GU Review Male:   Patient reports frequent urination, get up at night to urinate, and stream starts and stops. Patient denies hard to postpone urination, burning/ pain with urination, leakage of urine, trouble starting your stream, have to strain to urinate , erection problems, and penile pain.  Gastrointestinal (Upper):   Patient denies nausea, vomiting, and indigestion/ heartburn.  Gastrointestinal (Lower):   Patient denies diarrhea and constipation.  Constitutional:   Patient denies fever, night sweats, weight loss, and fatigue.  Skin:   Patient denies skin rash/ lesion and itching.  Eyes:   Patient denies blurred vision and double vision.  Ears/ Nose/ Throat:   Patient denies sore throat and sinus problems.  Hematologic/Lymphatic:   Patient denies swollen glands and easy bruising.  Cardiovascular:   Patient denies leg swelling and chest pains.  Respiratory:  Patient denies cough and shortness of breath.  Endocrine:   Patient denies excessive thirst.  Musculoskeletal:   Patient denies back pain and joint pain.  Neurological:   Patient denies headaches and dizziness.  Psychologic:   Patient denies depression and anxiety.   Notes:  Reviewed previous review of systems 09/22/2015. No changes.    VITAL SIGNS:      11/18/2015 04:40 PM  BP 111/73 mmHg  Pulse 98 /min  Temperature 97.7 F / 36 C   GU PHYSICAL EXAMINATION:    Anus and Perineum: No hemorrhoids. No anal stenosis. No rectal fissure, no anal fissure. No edema, no dimple, no perineal tenderness, no anal tenderness.  Scrotum: No lesions. No edema. No cysts. No warts.  Epididymides: Right: no spermatocele, no masses, no cysts, no tenderness, no induration, no enlargement. Left: no spermatocele, no masses, no cysts, no tenderness, no induration, no enlargement.  Testes: No tenderness, no swelling, no enlargement left testes. No tenderness, no swelling, no enlargement right testes. Normal location left testes. Normal location right testes. No mass, no cyst, no varicocele, no hydrocele left testes. No mass, no cyst, no varicocele, no hydrocele right testes.  Urethral Meatus: Normal size. No lesion, no wart, no discharge, no polyp. Normal location.  Penis: Circumcised, no warts, no cracks. No dorsal Peyronie's plaques, no left corporal Peyronie's plaques, no right corporal Peyronie's plaques, no scarring, no warts. No balanitis, no meatal stenosis.  Prostate: 40 gram or 2+ size. Left lobe normal consistency, right lobe normal consistency. Symmetrical lobes. No prostate nodule. Left lobe no tenderness, right lobe no tenderness.  Seminal Vesicles: Nonpalpable.  Sphincter Tone: Normal sphincter. No rectal tenderness. No rectal mass.    MULTI-SYSTEM PHYSICAL EXAMINATION:    Constitutional: Thin. No physical deformities. Normally developed. Good grooming.   Neck: Neck symmetrical, not swollen. Normal tracheal position.  Respiratory: No labored breathing, no use of accessory muscles.   Cardiovascular: Normal temperature, normal extremity pulses, no swelling, no varicosities.  Lymphatic: No enlargement of neck, axillae, groin.  Skin: No paleness, no jaundice, no cyanosis. No  lesion, no ulcer, no rash.  Neurologic / Psychiatric: Oriented to time, oriented to place, oriented to person. No depression, no anxiety, no agitation.  Gastrointestinal: No mass, no tenderness, no rigidity, non obese abdomen.  Eyes: Normal conjunctivae. Normal eyelids.  Ears, Nose, Mouth, and Throat: Teeth poor dentition, crooked. Left ear no scars, no lesions, no masses. Right ear no scars, no lesions, no masses. Nose no scars, no lesions, no masses. Normal hearing. Normal lips.   Musculoskeletal: Normal gait and station of head and neck.     PAST DATA REVIEWED:  Source Of History:  Patient  Lab Test Review:   Path Report  Records Review:   Previous Patient Records   09/22/15 05/20/14  Urinalysis  Urine Appearance Cloudy    Urine Specimen Voided    Urine Color Yellow    Urine Glucose Neg    Urine Bilirubin Neg    Urine Ketones Neg    Urine Specific Gravity 1.010    Urine Blood Neg    Urine pH 6.5    Urine Protein Neg    Urine Urobilinogen 0.2    Urine Nitrites Neg    Urine Leukocyte Esterase Neg    Urine WBC/hpf 0-5/hpf    Urine RBC/hpf NS (Not Seen)    Urine Epithelial Cells NS (Not Seen)    Urine Bacteria NS (Not Seen)    Urine Mucous Not Present    Urine  Yeast NS (Not Seen)    Urine Trichomonas Not Present    Urine Cystals Amorph Urates    Urine Casts NS (Not Seen)    Urine Sperm Not Present    Prostate Histology  Prostate Biopsy  Diagnostic Images Are Available In PACS For This Exam.    PROCEDURES: None   ASSESSMENT:      ICD-10 Details  1 GU:   Prostate Cancer - C61           Notes:   75 year old male, with PSA from [facility] hospital rising from 4.29 and 2015, to 6.29, to 7.34.  Prostate MRI showed  abnormalities of the peripheral zone, and prostate biopsy Shows Gleason 3+3 = 6 in the Right Mid Apical segment.   The patient's wife returned  for follow-up exam and discussion ( 60 Minutes). The patient understands that he has a single area of prostate cancer, which is  low-grade, 20% of a single biopsy. However, he has a PSA, which is high enough to be of some concern at 7.34. He understands that he could consider having watchful waiting treatment, which would give him a PSA every 4 months for 12-18 months, with repeat biopsy; or he could consider having more definitive therapy. Definitive therapies might include radical prostatectomy, or radiation therapy. Radical prostatectomy seens to be too aggressive a  treatment for him, but radiation therapy, particularly with I-125 seed therapy is potentially an attractive option for this locally confined prostate disease. We reviewed the [place], which shows a 65% organ confined probability, with 34% extra capsular extension but only 1% lymph node involvement and 1% seminal vesicle. We have discussed the possibility of alternative treatments such as HIFU, which she has entertained before, and I have discouraged the use of this therapy, because of its current expense ( lack of insurance coverage), lack of objective data, and difficulty in controlling the tissue destruction, wit the possibility of increased complication rate.  We have discussed the possibility also of cryotherapy, but  I would reserve that for radiation failure for the future. I believe he would do best with a second opinion with radiation therapy, and we'll arrange this with Dr. Tyler Pita, to perhaps consider I-125 seed therapy, or external beam radiation therapy in view of his elevated PSA, despite low volume and low grade cancer on biopsy. The patient's wife have all the information, and have been reviewed extensively. They will see Dr. Tammi Klippel, considering his options, and will return as needed.    PLAN:            Medications Stop Meds: Levaquin 500 mg tablet 1 tablet the day before procedure, 1 tablet the day of procedure and 1 tablet the day after procedure  Start: 10/20/2015  Discontinue: 11/18/2015  - Reason: The medication cycle  was completed.  Diazepam 10 mg tablet 2 tablets 1 hour prior to procedure  Start: 10/20/2015  Discontinue: 11/18/2015  - Reason: The medication cycle was completed.            Patient Handouts Provided Patient Education Sheets    Prostate Cancer - 185          Document Letter(s):  Created for Patient: Clinical Summary         Notes:   cc: Dr. Tyler Pita    Signed by Carolan Clines, M.D. on 11/18/15 at 5:51 PM (EDT)     The information contained in this medical record document is considered private and confidential patient information. This information  can only be used for the medical diagnosis and/or medical services that are being provided by the patient's selected caregivers. This information can only be distributed outside of the patient's care if the patient agrees and signs waivers of authorization for this information to be sent to an outside source or route.

## 2016-02-05 NOTE — Discharge Instructions (Addendum)
°Brachytherapy for Prostate Cancer, Care After °Refer to this sheet in the next few weeks. These instructions provide you with information on caring for yourself after your procedure. Your health care provider may also give you more specific instructions. Your treatment has been planned according to current medical practices, but problems sometimes occur. Call your health care provider if you have any problems or questions after your procedure. °WHAT TO EXPECT AFTER THE PROCEDURE °The area behind the scrotum will probably be tender and bruised. For a short period of time you may have: °· Difficulty passing urine. You may need a catheter for a few days to a month. °· Blood in the urine or semen. °· A feeling of constipation because of prostate swelling. °· Frequent feeling of an urgent need to urinate. °For a long period of time you may have: °· Inflammation of the rectum. This happens in about 2% of people who have the procedure. °· Erection problems. These vary with age and occur in about 15-40% of men. °· Difficulty urinating. This is caused by scarring in the urethra. °· Diarrhea. °HOME CARE INSTRUCTIONS  °· Take medicines only as directed by your health care provider. °· You will probably have a catheter in your bladder for several days. You will have blood in the urine bag and should drink a lot of fluids to keep it a light red color. °· Keep all follow-up visits as directed by your health care provider. If you have a catheter, it will be removed during one of these visits. °· Try not to sit directly on the area behind the scrotum. A soft cushion can decrease the discomfort. Ice packs may also be helpful for the discomfort. Do not put ice directly on the skin. °· Shower and wash the area behind the scrotum gently. Do not sit in a tub. °· If you have had the brachytherapy that uses the seeds, limit your close contact with children and pregnant women for 2 months because of the radiation still in the prostate.  After that period of time, the levels drop off quickly. °SEEK IMMEDIATE MEDICAL CARE IF:  °· You have a fever. °· You have chills. °· You have shortness of breath. °· You have chest pain. °· You have thick blood, like tomato juice, in the urine bag. °· Your catheter is blocked so urine cannot get into the bag. Your bladder area or lower abdomen may be swollen. °· There is excessive bleeding from your rectum. It is normal to have a little blood mixed with your stool. °· There is severe discomfort in the treated area that does not go away with pain medicine. °· You have abdominal discomfort. °· You have severe nausea or vomiting. °· You develop any new or unusual symptoms. °  °This information is not intended to replace advice given to you by your health care provider. Make sure you discuss any questions you have with your health care provider. °  °Document Released: 04/24/2010 Document Revised: 04/12/2014 Document Reviewed: 09/12/2012 °Elsevier Interactive Patient Education ©2016 [employer] ° ° °Post Anesthesia Home Care Instructions ° °Activity: °Get plenty of rest for the remainder of the day. A responsible adult should stay with you for 24 hours following the procedure.  °For the next 24 hours, DO NOT: °-Drive a car °-Operate machinery °-Drink alcoholic beverages °-Take any medication unless instructed by your physician °-Make any legal decisions or sign important papers. ° °Meals: °Start with liquid foods such as gelatin or soup. Progress to regular   foods as tolerated. Avoid greasy, spicy, heavy foods. If nausea and/or vomiting occur, drink only clear liquids until the nausea and/or vomiting subsides. Call your physician if vomiting continues. ° °Special Instructions/Symptoms: °Your throat may feel dry or sore from the anesthesia or the breathing tube placed in your throat during surgery. If this causes discomfort, gargle with warm salt water. The discomfort should disappear within 24 hours. ° °If you had a  scopolamine patch placed behind your ear for the management of post- operative nausea and/or vomiting: ° °1. The medication in the patch is effective for 72 hours, after which it should be removed.  Wrap patch in a tissue and discard in the trash. Wash hands thoroughly with soap and water. °2. You may remove the patch earlier than 72 hours if you experience unpleasant side effects which may include dry mouth, dizziness or visual disturbances. °3. Avoid touching the patch. Wash your hands with soap and water after contact with the patch. °  ° °

## 2016-02-05 NOTE — Anesthesia Preprocedure Evaluation (Addendum)
Anesthesia Evaluation  Patient identified by MRN, date of birth, ID band Patient awake    Reviewed: Allergy & Precautions, NPO status , Patient's Chart, lab work & pertinent test results  Airway Mallampati: I  TM Distance: >3 FB Neck ROM: Full    Dental  (+) Teeth Intact, Dental Advisory Given, Caps,    Pulmonary former smoker,    breath sounds clear to auscultation       Cardiovascular hypertension, Pt. on medications  Rhythm:Regular Rate:Normal     Neuro/Psych negative neurological ROS  negative psych ROS   GI/Hepatic negative GI ROS, Neg liver ROS,   Endo/Other  negative endocrine ROS  Renal/GU negative Renal ROS  negative genitourinary   Musculoskeletal  (+) Arthritis , Osteoarthritis,    Abdominal   Peds negative pediatric ROS (+)  Hematology negative hematology ROS (+)   Anesthesia Other Findings   Reproductive/Obstetrics negative OB ROS                           Lab Results  Component Value Date   WBC 7.2 02/02/2016   HGB 14.0 02/02/2016   HCT 41.0 02/02/2016   MCV 86.1 02/02/2016   PLT 291 02/02/2016   Lab Results  Component Value Date   CREATININE 0.64 02/02/2016   BUN 15 02/02/2016   NA 136 02/02/2016   K 4.0 02/02/2016   CL 104 02/02/2016   CO2 25 02/02/2016   Lab Results  Component Value Date   INR 0.96 02/02/2016   INR 1.82 (H) 08/14/2010   INR 1.01 08/13/2010   12/2015 EKG: normal sinus rhythm.  Anesthesia Physical Anesthesia Plan  ASA: II  Anesthesia Plan: General   Post-op Pain Management:    Induction: Intravenous  Airway Management Planned: LMA  Additional Equipment:   Intra-op Plan:   Post-operative Plan: Extubation in OR  Informed Consent: I have reviewed the patients History and Physical, chart, labs and discussed the procedure including the risks, benefits and alternatives for the proposed anesthesia with the patient or authorized  representative who has indicated his/her understanding and acceptance.   Dental advisory given  Plan Discussed with: CRNA  Anesthesia Plan Comments:         Anesthesia Quick Evaluation

## 2016-02-05 NOTE — Transfer of Care (Signed)
Immediate Anesthesia Transfer of Care Note  Patient: Andrew Day  Procedure(s) Performed: Procedure(s): RADIOACTIVE SEED IMPLANT (N/A)  Patient Location: PACU  Anesthesia Type:General  Level of Consciousness: awake, alert , oriented and patient cooperative  Airway & Oxygen Therapy: Patient Spontanous Breathing and Patient connected to nasal cannula oxygen  Post-op Assessment: Report given to RN and Post -op Vital signs reviewed and stable  Post vital signs: Reviewed and stable  Last Vitals:  Vitals:   02/05/16 0821  BP: 115/71  Pulse: 71  Resp: 18  Temp: (!) 36.1 C    Last Pain:  Vitals:   02/05/16 0821  TempSrc: Oral         Complications: No apparent anesthesia complications

## 2016-02-05 NOTE — Progress Notes (Signed)
  Radiation Oncology         [phone] ________________________________  Name: Sandra Lafevers MRN: [id number]  Date: 02/05/2016  DOB: [birth year]       Prostate Seed Implant  [id number] Marcello Moores, MD  No ref. provider found  DIAGNOSIS:  75 y.o. 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 Prostate cancer ([place]) 185 C61 DG Chest 2 View     DG Chest 2 View     Discontinue IV     Discharge patient     Continue foley catheter    PROCEDURE: Insertion of radioactive I-125 seeds into the prostate gland.  RADIATION DOSE: 145 Gy, definitive therapy.  TECHNIQUE: Rodrico Asplund was brought to the operating room with the urologist. He was placed in the dorsolithotomy position. He was catheterized and a rectal tube was inserted. The perineum was shaved, prepped and draped. The ultrasound probe was then introduced into the rectum to see the prostate gland.  TREATMENT DEVICE: A needle grid was attached to the ultrasound probe stand and anchor needles were placed.  3D PLANNING: The prostate was imaged in 3D using a sagittal sweep of the prostate probe. These images were transferred to the planning computer. There, the prostate, urethra and rectum were defined on each axial reconstructed image. Then, the software created an optimized 3D plan and a few seed positions were adjusted. The quality of the plan was reviewed using [facility] information for the target and the following two organs at risk:  Urethra and Rectum.  Then the accepted plan was uploaded to the seed Selectron afterloading unit.  PROSTATE VOLUME STUDY:  Using transrectal ultrasound the volume of the prostate was verified to be 79 cc.  SPECIAL TREATMENT PROCEDURE/SUPERVISION AND HANDLING: The Nucletron FIRST system was used to place the needles under sagittal guidance. A total of 23 needles were used to deposit 85 seeds in the prostate gland. The individual seed  activity was 0.592 mCi.  COMPLEX SIMULATION: At the end of the procedure, an anterior radiograph of the pelvis was obtained to document seed positioning and count. Cystoscopy was performed to check the urethra and bladder.  MICRODOSIMETRY: At the end of the procedure, the patient was emitting 0.38 mR/hr at 1 meter. Accordingly, he was considered safe for hospital discharge.  PLAN: The patient will return to the radiation oncology clinic for post implant CT dosimetry in three weeks.   ________________________________  Sheral Apley Tammi Klippel, M.D.

## 2016-02-05 NOTE — Op Note (Signed)
Pre-operative diagnosis :   T1c CaP Gleason 6  Postoperative diagnosis:  Same  Operation:  I-125 seed Rx prostate cancer  Surgeon:  S. Gaynelle Arabian, MD  Radiation Therapist: Dr. Philis Pique  Anesthesia:  General LMA  Preparation:  After appropriate preanesthesia, the patient was brought the operative room, placed on the operative table in the dorsal supine position where general LMA anesthesia was induced. He was replaced in the dorsal lithotomy position with pubis was prepped with Betadine solution and draped in usual fashion. The history was reviewed. The armband was double checked.  Review history:  GU:   Prostate Cancer - C61           Notes:   75 year old male, with PSA from [place] rising from 4.29 and 2015, to 6.29, to 7.34.  Prostate MRI showed  abnormalities of the peripheral zone, and prostate biopsy Shows Gleason 3+3 = 6 in the Right Mid Apical segment.   The patient's wife returned  for follow-up exam and discussion ( 60 Minutes). The patient understands that he has a single area of prostate cancer, which is low-grade, 20% of a single biopsy. However, he has a PSA, which is high enough to be of some concern at 7.34. He understands that he could consider having watchful waiting treatment, which would give him a PSA every 4 months for 12-18 months, with repeat biopsy; or he could consider having more definitive therapy. Definitive therapies might include radical prostatectomy, or radiation therapy. Radical prostatectomy seens to be too aggressive a  treatment for him, but radiation therapy, particularly with I-125 seed therapy is potentially an attractive option for this locally confined prostate disease. We reviewed the [place], which shows a 65% organ confined probability, with 34% extra capsular extension but only 1% lymph node involvement and 1% seminal vesicle. We have discussed the possibility of alternative treatments such as HIFU, which she has  entertained before, and I have discouraged the use of this therapy, because of its current expense ( lack of insurance coverage), lack of objective data, and difficulty in controlling the tissue destruction, wit the possibility of increased complication rate.  We have discussed the possibility also of cryotherapy, but  I would reserve that for radiation failure for the future. I believe he would do best with a second opinion with radiation therapy, and we'll arrange this with Dr. Tyler Pita, to perhaps consider I-125 seed therapy, or external beam radiation therapy in view of his elevated PSA, despite low volume and low grade cancer on biopsy. The patient's wife have all the information, and have been reviewed extensively. They will see Dr. Tammi Klippel, considering his options, and will return as needed.   Statement of  Likelihood of Success: Excellent. TIME-OUT observed.:  Procedure:  Following radiation therapy repeat evaluation under anesthesia, and with the patient in the dorsal lithotomy position, 23 needles were placed, with 85 radioactive seeds activated. This yielded the prescribed dose of 145 gray. Care was taken to avoid any injury to the surrounding anatomic structures, including the urethra, rectum, small bowel, and bladder. Seeds were placed under both ultrasonic and fluoroscopic control.  Following placement of the eye-125 seeds, cystourethroscopy was accomplished, showing no evidence of any seeds within the prostatic urethra, or within the bladder. Cystoscopy did show trabeculation within the bladder, but no evidence of bladder stone, tumor, or diverticular formation. Clear reflux was seen from both ureteral orifices on a normal trigone.  A 16 French Foley catheter was placed with 10 mL  in the balloon to straight drainage.  The patient received IV Toradol, and IV Tylenol. He was awakened and taken to recovery room in good condition.

## 2016-02-05 NOTE — Interval H&P Note (Signed)
History and Physical Interval Note:  02/05/2016 10:02 AM  Andrew Day  has presented today for surgery, with the diagnosis of PROSTATE CA  The various methods of treatment have been discussed with the patient and family. After consideration of risks, benefits and other options for treatment, the patient has consented to  Procedure(s): RADIOACTIVE SEED IMPLANT (N/A) as a surgical intervention .  The patient's history has been reviewed, patient examined, no change in status, stable for surgery.  I have reviewed the patient's chart and labs.  Questions were answered to the patient's satisfaction.     Andrew Day I Andrew Day

## 2016-02-06 ENCOUNTER — Encounter (HOSPITAL_BASED_OUTPATIENT_CLINIC_OR_DEPARTMENT_OTHER): Payer: Self-pay | Admitting: Urology

## 2016-03-02 DIAGNOSIS — R351 Nocturia: Secondary | ICD-10-CM | POA: Diagnosis not present

## 2016-03-03 ENCOUNTER — Telehealth: Payer: Self-pay | Admitting: *Deleted

## 2016-03-03 NOTE — Telephone Encounter (Signed)
Called patient to remind of post seed appts. For 03-04-16, spoke with patient and he is aware of these appts.

## 2016-03-04 ENCOUNTER — Encounter: Payer: Self-pay | Admitting: Radiation Oncology

## 2016-03-04 ENCOUNTER — Ambulatory Visit
Admission: RE | Admit: 2016-03-04 | Discharge: 2016-03-04 | Disposition: A | Discharge status: Home / Self Care | Payer: Medicare Other | Source: Ambulatory Visit | Attending: Radiation Oncology | Admitting: Radiation Oncology

## 2016-03-04 ENCOUNTER — Encounter: Payer: Self-pay | Admitting: Internal Medicine

## 2016-03-04 DIAGNOSIS — C61 Malignant neoplasm of prostate: Secondary | ICD-10-CM

## 2016-03-04 NOTE — Progress Notes (Signed)
Radiation Oncology         (336) [phone] ________________________________  Name: Andrew Day MRN: [id number]  Date: 03/04/2016  DOB: [birth year]    Follow-Up Visit Note  CC: Mathews Argyle, MD  Carolan Clines, MD  Diagnosis:   75 y.o. 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 Malignant neoplasm of prostate ([place]) 185 C61     Interval Since Last Radiation:  4 weeks (02/05/2016)  Narrative:  The patient returns today for routine follow-up.  He is complaining of increased urinary frequency and urinary hesitation symptoms. He filled out a questionnaire regarding urinary function today providing and overall IPSS score of 13 characterizing his symptoms as urinary frequency, intermittency, nocturia x 2-3, and weak stream at night. Reports hematuria and dysuria have resolved. Denies leakage or incontinence. His pre-implant score was 9. He denies any bowel symptoms. He received a prescription for Flomax from Dr. Gaynelle Arabian but has not filled it. He will follow up in urology with Dr. Gaynelle Arabian in March.   On review of systems, the patient reports that he is doing well overall. He denies any chest pain, shortness of breath, cough, fevers, chills, night sweats, unintended weight changes. He denies any bowel disturbances, and denies abdominal pain, nausea or vomiting. He denies any new musculoskeletal or joint aches or pains. A complete review of systems is obtained and is otherwise negative.   ALLERGIES:  has No Known Allergies.  Meds: Current Outpatient Prescriptions  Medication Sig Dispense Refill  . amLODipine (NORVASC) 10 MG tablet Take 10 mg by mouth every morning.     . Ascorbic Acid (VITAMIN C) 1000 MG tablet Take 1,000 mg by mouth daily.    Marland Kitchen aspirin EC 81 MG tablet Take 81 mg by mouth daily.    . Calcium Carbonate-Vit D-Min (CALCIUM 1200 PO) Take by mouth.    . Cholecalciferol (VITAMIN D3)  1000 units CAPS Take by mouth.    . hydrochlorothiazide (HYDRODIURIL) 25 MG tablet Take 25 mg by mouth every morning.     Marland Kitchen Hyoscyamine Sulfate SL (LEVSIN/SL) 0.125 MG SUBL Place 0.125 mg under the tongue every 4 (four) hours as needed. 30 each 0  . lisinopril (PRINIVIL,ZESTRIL) 20 MG tablet Take 20 mg by mouth every morning.    . meloxicam (MOBIC) 15 MG tablet Take 1 tablet (15 mg total) by mouth daily. 30 tablet 0  . Multiple Vitamins-Minerals (MULTIVITAMIN ADULT PO) Take by mouth.    . Omega-3 Fatty Acids (FISH OIL) 1000 MG CAPS Take by mouth. Every other day    . oxyCODONE-acetaminophen (ROXICET) 5-325 MG tablet Take 1 tablet by mouth every 6 (six) hours as needed for severe pain. NOTE: Causes constipation. 30 tablet 0  . phenazopyridine (PYRIDIUM) 200 MG tablet Take 1 tablet (200 mg total) by mouth 3 (three) times daily as needed for pain. For urinary burning Note: will stain clothing 30 tablet 3  . psyllium (METAMUCIL) 58.6 % powder Take 1 packet by mouth 3 (three) times daily.    Marland Kitchen trimethoprim (TRIMPEX) 100 MG tablet Take 1 tablet (100 mg total) by mouth daily. 30 tablet 1   No current facility-administered medications for this encounter.     Physical Findings:  blood pressure is 115/73 and his pulse is 70. His respiration is 18 and oxygen saturation is 100%. .   In general this is a well appearing Caucasian male in no acute  distress. He's alert and oriented x4 and appropriate throughout the examination. Cardiopulmonary assessment is negative for acute distress and he exhibits normal effort.    Lab Findings: Lab Results  Component Value Date   WBC 7.2 02/02/2016   HGB 14.0 02/02/2016   HCT 41.0 02/02/2016   MCV 86.1 02/02/2016   PLT 291 02/02/2016    Radiographic Findings:  Patient underwent CT imaging in our clinic for post implant dosimetry. The CT appears to demonstrate an adequate distribution of radioactive seeds throughout the prostate gland. There no seeds in her near  the rectum. I suspect the final radiation plan and dosimetry will show appropriate coverage of the prostate gland.   Impression: The patient is recovering from the effects of radiation. His urinary symptoms should gradually improve over the next 4-6 months. We talked about this today. He is encouraged by his improvement already and is otherwise please with his outcome.   Plan: Today, I spent time talking to the patient about his prostate seed implant and resolving urinary symptoms. We also talked about long-term follow-up for prostate cancer following seed implant. He understands that ongoing PSA determinations and digital rectal exams will help perform surveillance to rule out disease recurrence. He understands what to expect with his PSA measures. Patient was also educated today about some of the long-term effects from radiation including a small risk for rectal bleeding and possibly erectile dysfunction. We talked about some of the general management approaches to these potential complications. However, I did encourage the patient to contact our office or return at any point if he has questions or concerns related to his previous radiation and prostate cancer.  _____________________________________  Sheral Apley. Tammi Klippel, M.D.   This document serves as a record of services personally performed by Tyler Pita, MD. It was created on his behalf by Arlyce Harman, a trained medical scribe. The creation of this record is based on the scribe's personal observations and the provider's statements to them. This document has been checked and approved by the attending provider.

## 2016-03-04 NOTE — Progress Notes (Signed)
Weight and vitals stable. Pre seed IPSS 9. Post seed IPSS 13 with frequency, intermittency, and weak stream. Reports hematuria and dysuria has resolved. Denies leakage or incontinence. Without bowel complaints. Preparing to leave for [place]. Patient inquiring about Flomax.  BP 115/73 (BP Location: Left Arm, Patient Position: Sitting, Cuff Size: Normal)   Pulse 70   Resp 18   SpO2 100%  Wt Readings from Last 3 Encounters:  02/05/16 158 lb 9 oz (71.9 kg)  12/15/15 165 lb 8 oz (75.1 kg)

## 2016-03-04 NOTE — Progress Notes (Signed)
  Radiation Oncology         [phone] ________________________________  Name: Andrew Day MRN: [id number]  Date: 03/04/2016  DOB: 09-20-1940  COMPLEX SIMULATION NOTE  NARRATIVE:  The patient was brought to the [facility] today following prostate seed implantation approximately one month ago.  Identity was confirmed.  All relevant records and images related to the planned course of therapy were reviewed.  Then, the patient was set-up supine.  CT images were obtained.  The CT images were loaded into the planning software.  Then the prostate and rectum were contoured.  Treatment planning then occurred.  The implanted iodine 125 seeds were identified by the physics staff for projection of radiation distribution  I have requested : 3D Simulation  I have requested a DVH of the following structures: Prostate and rectum.    ________________________________  Sheral Apley Tammi Klippel, M.D.  This document serves as a record of services personally performed by Tyler Pita, MD. It was created on his behalf by Arlyce Harman, a trained medical scribe. The creation of this record is based on the scribe's personal observations and the provider's statements to them. This document has been checked and approved by the attending provider.

## 2016-03-08 ENCOUNTER — Encounter: Payer: Self-pay | Admitting: Radiation Oncology

## 2016-03-08 DIAGNOSIS — C61 Malignant neoplasm of prostate: Secondary | ICD-10-CM | POA: Diagnosis not present

## 2016-03-20 NOTE — Progress Notes (Signed)
  Radiation Oncology         (336) [phone] ________________________________  Name: Andrew Day MRN: [id number]  Date: 03/08/2016  DOB: 1940-04-24  3D Planning Note   Prostate Brachytherapy Post-Implant Dosimetry  Diagnosis:  75 y.o. 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 He underwent CT scan complex simulation to delineate the three-dimensional structures of the pelvis and demonstrate the radiation distribution.  Since that time, the seed localization, and complex isodose planning with dose volume histograms have now been completed.  Results:   Prostate Coverage - The dose of radiation delivered to the 90% or more of the prostate gland (D90) was 113% of the prescription dose. This exceeds our goal of greater than 90%. Rectal Sparing - The volume of rectal tissue receiving the prescription dose or higher was 0.49 cc. This falls under our thresholds tolerance of 1.0 cc.  Impression: The prostate seed implant appears to show adequate target coverage and appropriate rectal sparing.  Plan:  The patient will continue to follow with urology for ongoing PSA determinations. I would anticipate a high likelihood for local tumor control with minimal risk for rectal morbidity.  ________________________________  Sheral Apley Tammi Klippel, M.D.

## 2016-05-20 ENCOUNTER — Encounter: Payer: Self-pay | Admitting: Internal Medicine

## 2016-06-22 DIAGNOSIS — C61 Malignant neoplasm of prostate: Secondary | ICD-10-CM | POA: Diagnosis not present

## 2016-06-29 DIAGNOSIS — R972 Elevated prostate specific antigen [PSA]: Secondary | ICD-10-CM | POA: Diagnosis not present

## 2016-06-29 DIAGNOSIS — C61 Malignant neoplasm of prostate: Secondary | ICD-10-CM | POA: Diagnosis not present

## 2016-06-29 DIAGNOSIS — R350 Frequency of micturition: Secondary | ICD-10-CM | POA: Diagnosis not present

## 2016-07-14 ENCOUNTER — Ambulatory Visit (AMBULATORY_SURGERY_CENTER): Payer: Self-pay | Admitting: *Deleted

## 2016-07-14 VITALS — Ht 71.0 in | Wt 159.0 lb

## 2016-07-14 DIAGNOSIS — Z1211 Encounter for screening for malignant neoplasm of colon: Secondary | ICD-10-CM

## 2016-07-14 MED ORDER — NA SULFATE-K SULFATE-MG SULF 17.5-3.13-1.6 GM/177ML PO SOLN: 1.0000 | Freq: Once | ORAL | 0 refills | Status: AC

## 2016-07-14 MED ORDER — NA SULFATE-K SULFATE-MG SULF 17.5-3.13-1.6 GM/177ML PO SOLN: 1.0000 | Freq: Once | ORAL | 0 refills | Status: DC

## 2016-07-14 NOTE — Progress Notes (Signed)
No egg or soy allergy known to patient  No issues with past sedation with any surgeries  or procedures, no intubation problems  No diet pills per patient No home 02 use per patient  No blood thinners per patient  Pt denies issues with constipation  No A fib or A flutter  emmi declined per pt

## 2016-07-16 ENCOUNTER — Telehealth: Payer: Self-pay | Admitting: Internal Medicine

## 2016-07-16 NOTE — Telephone Encounter (Signed)
Called pt.  Left sample at 4th floor desk.  He will be here Monday 07/19/16 Thanks, Angela/PV

## 2016-07-20 DIAGNOSIS — Z0000 Encounter for general adult medical examination without abnormal findings: Secondary | ICD-10-CM | POA: Diagnosis not present

## 2016-07-20 DIAGNOSIS — Z1389 Encounter for screening for other disorder: Secondary | ICD-10-CM | POA: Diagnosis not present

## 2016-07-20 DIAGNOSIS — C61 Malignant neoplasm of prostate: Secondary | ICD-10-CM | POA: Diagnosis not present

## 2016-07-20 DIAGNOSIS — Z79899 Other long term (current) drug therapy: Secondary | ICD-10-CM | POA: Diagnosis not present

## 2016-07-20 DIAGNOSIS — M85879 Other specified disorders of bone density and structure, unspecified ankle and foot: Secondary | ICD-10-CM | POA: Diagnosis not present

## 2016-07-20 DIAGNOSIS — Z23 Encounter for immunization: Secondary | ICD-10-CM | POA: Diagnosis not present

## 2016-07-20 DIAGNOSIS — I10 Essential (primary) hypertension: Secondary | ICD-10-CM | POA: Diagnosis not present

## 2016-07-28 ENCOUNTER — Ambulatory Visit (AMBULATORY_SURGERY_CENTER): Payer: Medicare Other | Admitting: Internal Medicine

## 2016-07-28 ENCOUNTER — Encounter: Payer: Self-pay | Admitting: Internal Medicine

## 2016-07-28 VITALS — BP 118/67 | HR 60 | Temp 97.8°F | Resp 14 | Ht 71.0 in | Wt 159.0 lb

## 2016-07-28 DIAGNOSIS — K635 Polyp of colon: Secondary | ICD-10-CM | POA: Diagnosis not present

## 2016-07-28 DIAGNOSIS — D123 Benign neoplasm of transverse colon: Secondary | ICD-10-CM

## 2016-07-28 DIAGNOSIS — I10 Essential (primary) hypertension: Secondary | ICD-10-CM | POA: Diagnosis not present

## 2016-07-28 DIAGNOSIS — Z1211 Encounter for screening for malignant neoplasm of colon: Secondary | ICD-10-CM

## 2016-07-28 DIAGNOSIS — Z1212 Encounter for screening for malignant neoplasm of rectum: Secondary | ICD-10-CM

## 2016-07-28 MED ORDER — SODIUM CHLORIDE 0.9 % IV SOLN: 500.0000 mL | INTRAVENOUS | Status: AC

## 2016-07-28 NOTE — Progress Notes (Signed)
No problems noted in the recovery room. maw 

## 2016-07-28 NOTE — Progress Notes (Signed)
Called to room to assist during endoscopic procedure.  Patient ID and intended procedure confirmed with present staff. Received instructions for my participation in the procedure from the performing physician.  

## 2016-07-28 NOTE — Patient Instructions (Signed)
YOU HAD AN ENDOSCOPIC PROCEDURE TODAY AT [facility]:   Refer to the procedure report that was given to you for any specific questions about what was found during the examination.  If the procedure report does not answer your questions, please call your gastroenterologist to clarify.  If you requested that your care partner not be given the details of your procedure findings, then the procedure report has been included in a sealed envelope for you to review at your convenience later.  YOU SHOULD EXPECT: Some feelings of bloating in the abdomen. Passage of more gas than usual.  Walking can help get rid of the air that was put into your GI tract during the procedure and reduce the bloating. If you had a lower endoscopy (such as a colonoscopy or flexible sigmoidoscopy) you may notice spotting of blood in your stool or on the toilet paper. If you underwent a bowel prep for your procedure, you may not have a normal bowel movement for a few days.  Please Note:  You might notice some irritation and congestion in your nose or some drainage.  This is from the oxygen used during your procedure.  There is no need for concern and it should clear up in a day or so.  SYMPTOMS TO REPORT IMMEDIATELY:   Following lower endoscopy (colonoscopy or flexible sigmoidoscopy):  Excessive amounts of blood in the stool  Significant tenderness or worsening of abdominal pains  Swelling of the abdomen that is new, acute  Fever of 100F or higher   For urgent or emergent issues, a gastroenterologist can be reached at any hour by calling [phone].   DIET:  We do recommend a small meal at first, but then you may proceed to your regular diet.  Drink plenty of fluids but you should avoid alcoholic beverages for 24 hours.  ACTIVITY:  You should plan to take it easy for the rest of today and you should NOT DRIVE or use heavy machinery until tomorrow (because of the sedation medicines used during the test).     FOLLOW UP: Our staff will call the number listed on your records the next business day following your procedure to check on you and address any questions or concerns that you may have regarding the information given to you following your procedure. If we do not reach you, we will leave a message.  However, if you are feeling well and you are not experiencing any problems, there is no need to return our call.  We will assume that you have returned to your regular daily activities without incident.  If any biopsies were taken you will be contacted by phone or by letter within the next 1-3 weeks.  Please call us at [phone] if you have not heard about the biopsies in 3 weeks.    SIGNATURES/CONFIDENTIALITY: You and/or your care partner have signed paperwork which will be entered into your electronic medical record.  These signatures attest to the fact that that the information above on your After Visit Summary has been reviewed and is understood.  Full responsibility of the confidentiality of this discharge information lies with you and/or your care-partner.    Handouts were given to your care partner on polyps and diverticulosis. You may resume your current medications today. Await biopsy results. If anal skin tag is bothersome, then surgical referral can be made to have this lesion removed. Please call if any questions or concerns.

## 2016-07-28 NOTE — Progress Notes (Signed)
Report to PACU, RN, vss, BBS= Clear.  

## 2016-07-28 NOTE — Op Note (Signed)
[facility] Patient Name: Andrew Day Procedure Date: 07/28/2016 10:35 AM MRN: [id number] Endoscopist: Jerene Bears , MD Age: 76 Referring MD:  Date of Birth: [birth year] Gender: Male Account #: [id number] Procedure:                Colonoscopy Indications:              Screening for colorectal malignant neoplasm, Last                            colonoscopy 10 years ago Medicines:                Monitored Anesthesia Care Procedure:                Pre-Anesthesia Assessment:                           - Prior to the procedure, a History and Physical                            was performed, and patient medications and                            allergies were reviewed. The patient's tolerance of                            previous anesthesia was also reviewed. The risks                            and benefits of the procedure and the sedation                            options and risks were discussed with the patient.                            All questions were answered, and informed consent                            was obtained. Prior Anticoagulants: The patient has                            taken no previous anticoagulant or antiplatelet                            agents. ASA Grade Assessment: II - A patient with                            mild systemic disease. After reviewing the risks                            and benefits, the patient was deemed in                            satisfactory condition to undergo the procedure.  After obtaining informed consent, the colonoscope                            was passed under direct vision. Throughout the                            procedure, the patient's blood pressure, pulse, and                            oxygen saturations were monitored continuously. The                            Colonoscope was introduced through the anus and                            advanced to the the cecum, identified by                             appendiceal orifice and ileocecal valve. The                            colonoscopy was performed without difficulty. The                            patient tolerated the procedure well. The quality                            of the bowel preparation was good. The ileocecal                            valve, appendiceal orifice, and rectum were                            photographed. Scope In: 10:53:34 AM Scope Out: 11:17:01 AM Scope Withdrawal Time: 0 hours 12 minutes 29 seconds  Total Procedure Duration: 0 hours 23 minutes 27 seconds  Findings:                 The digital rectal exam was normal.                           A 2 mm polyp was found in the proximal transverse                            colon. The polyp was sessile. The polyp was removed                            with a cold biopsy forceps. Resection and retrieval                            were complete.                           Multiple small and large-mouthed diverticula were  found from hepatic flexure to sigmoid colon.                           Anal papillae were hypertrophied with anal skin tag                            (seen clearly below the dentate line on retroflexed                            views) Complications:            No immediate complications. Estimated Blood Loss:     Estimated blood loss was minimal. Impression:               - One 2 mm polyp in the proximal transverse colon,                            removed with a cold biopsy forceps. Resected and                            retrieved.                           - Moderate diverticulosis from hepatic flexure to                            sigmoid colon.                           - Anal papillae were hypertrophied with anal skin                            tag. Recommendation:           - Patient has a contact number available for                            emergencies. The signs and symptoms of  potential                            delayed complications were discussed with the                            patient. Return to normal activities tomorrow.                            Written discharge instructions were provided to the                            patient.                           - Resume previous diet.                           - Continue present medications.                           -  Await pathology results.                           - Repeat colonoscopy may be recommended. The                            colonoscopy date will be determined after pathology                            results from today's exam become available for                            review.                           - If anal skin tag is bothersome then surgical                            referral can be made to have this lesion removed. Jerene Bears, MD 07/28/2016 11:24:41 AM This report has been signed electronically.

## 2016-07-28 NOTE — Progress Notes (Signed)
Pt up to restroom to sit on the toilet and try to pass flatus.  Unable to expel any flatus.  Pt's abdomen was soft and denied he needed to pass flatus.  He also denied any abd pain.  I spoke with Dr. Hilarie Fredrickson, ok to d/c to home.  Dr. Hilarie Fredrickson reported he decompressed pt well will exiting the pt's colon.  Pt had no complaints noted on discharge.  maw

## 2016-07-29 ENCOUNTER — Telehealth: Payer: Self-pay

## 2016-07-29 NOTE — Telephone Encounter (Signed)
  Follow up Call-  Call back number 07/28/2016  Post procedure Call Back phone  # [phone]  Permission to leave phone message Yes  Some recent data might be hidden     Patient questions:  Do you have a fever, pain , or abdominal swelling? No. Pain Score  0 *  Have you tolerated food without any problems? Yes.    Have you been able to return to your normal activities? Yes.    Do you have any questions about your discharge instructions: Diet   No. Medications  No. Follow up visit  No.  Do you have questions or concerns about your Care? No.  Actions: * If pain score is 4 or above: No action needed, pain <4.

## 2016-08-03 ENCOUNTER — Encounter: Payer: Self-pay | Admitting: Internal Medicine

## 2016-09-02 DIAGNOSIS — M810 Age-related osteoporosis without current pathological fracture: Secondary | ICD-10-CM | POA: Diagnosis not present

## 2016-09-02 DIAGNOSIS — M8588 Other specified disorders of bone density and structure, other site: Secondary | ICD-10-CM | POA: Diagnosis not present

## 2016-09-22 DIAGNOSIS — M810 Age-related osteoporosis without current pathological fracture: Secondary | ICD-10-CM | POA: Diagnosis not present

## 2017-01-14 DIAGNOSIS — Z23 Encounter for immunization: Secondary | ICD-10-CM | POA: Diagnosis not present

## 2017-01-24 DIAGNOSIS — Z79899 Other long term (current) drug therapy: Secondary | ICD-10-CM | POA: Diagnosis not present

## 2017-01-24 DIAGNOSIS — I10 Essential (primary) hypertension: Secondary | ICD-10-CM | POA: Diagnosis not present

## 2017-02-04 DIAGNOSIS — C61 Malignant neoplasm of prostate: Secondary | ICD-10-CM | POA: Diagnosis not present

## 2017-02-11 DIAGNOSIS — C61 Malignant neoplasm of prostate: Secondary | ICD-10-CM | POA: Diagnosis not present

## 2017-08-02 DIAGNOSIS — Z0000 Encounter for general adult medical examination without abnormal findings: Secondary | ICD-10-CM | POA: Diagnosis not present

## 2017-08-02 DIAGNOSIS — Z79899 Other long term (current) drug therapy: Secondary | ICD-10-CM | POA: Diagnosis not present

## 2017-08-02 DIAGNOSIS — M810 Age-related osteoporosis without current pathological fracture: Secondary | ICD-10-CM | POA: Diagnosis not present

## 2017-08-02 DIAGNOSIS — Z1389 Encounter for screening for other disorder: Secondary | ICD-10-CM | POA: Diagnosis not present

## 2017-08-02 DIAGNOSIS — C61 Malignant neoplasm of prostate: Secondary | ICD-10-CM | POA: Diagnosis not present

## 2017-08-02 DIAGNOSIS — I10 Essential (primary) hypertension: Secondary | ICD-10-CM | POA: Diagnosis not present

## 2017-08-02 DIAGNOSIS — R6889 Other general symptoms and signs: Secondary | ICD-10-CM | POA: Diagnosis not present

## 2017-08-17 DIAGNOSIS — C61 Malignant neoplasm of prostate: Secondary | ICD-10-CM | POA: Diagnosis not present

## 2017-08-24 DIAGNOSIS — C61 Malignant neoplasm of prostate: Secondary | ICD-10-CM | POA: Diagnosis not present

## 2017-12-16 DIAGNOSIS — Z23 Encounter for immunization: Secondary | ICD-10-CM | POA: Diagnosis not present

## 2018-02-06 DIAGNOSIS — Z7184 Encounter for health counseling related to travel: Secondary | ICD-10-CM | POA: Diagnosis not present

## 2018-02-06 DIAGNOSIS — Z79899 Other long term (current) drug therapy: Secondary | ICD-10-CM | POA: Diagnosis not present

## 2018-02-06 DIAGNOSIS — I10 Essential (primary) hypertension: Secondary | ICD-10-CM | POA: Diagnosis not present

## 2018-03-13 IMAGING — MR MR PROSTATE WO/W CM
23 of 53 series · 23 of 53 positions shown · IV contrast (multihance)
Comparison: None.

CLINICAL DATA: Elevated PSA level. Recent prostate biopsy showing
focus of high-grade prostatic intraepithelial neoplasia.

EXAM:
MR PROSTATE WITHOUT AND WITH CONTRAST
TECHNIQUE: Multiplanar multisequence MRI images were obtained of the pelvis
centered about the prostate. Pre and post contrast images were
obtained.
CONTRAST:  15mL MULTIHANCE GADOBENATE DIMEGLUMINE 529 MG/ML IV SOLN

[Series 3: bSSFP fat-sat · axial · 6.0mm · 0.86mm/px · 1 of 41 slices shown]
[im 1/41]
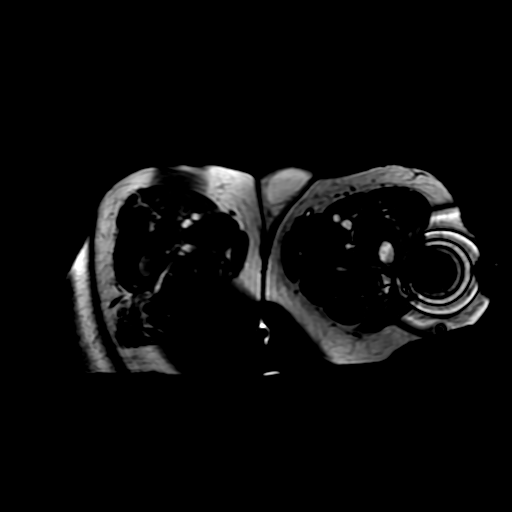

[Series 4: T1 · axial · 8.0mm · 0.70mm/px · 1 of 30 slices shown (1 of 2)]
[im 1/30]
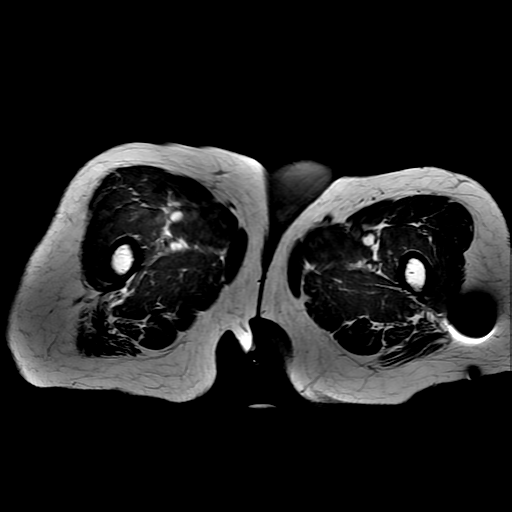

[Series 5: T2 · axial · 3.0mm · 0.29mm/px · 1 of 28 slices shown (1 of 3)]
[im 1/28]
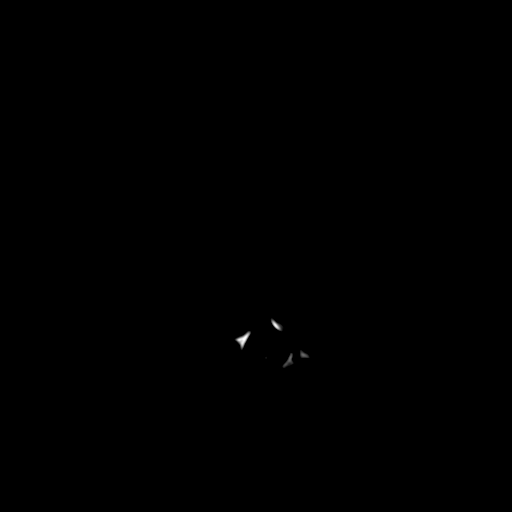

[Series 6: T1 · axial · 3.0mm · 0.29mm/px · 1 of 28 slices shown (2 of 2)]
[im 1/28]
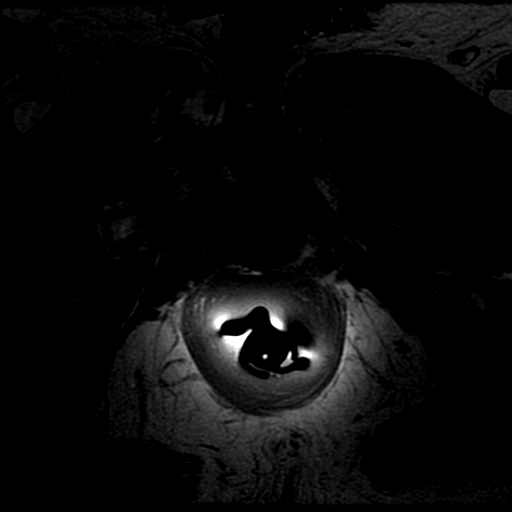

[Series 7: T2 · sagittal · 4.0mm · 0.29mm/px · 1 of 25 slices shown (2 of 3)]
[im 1/25]
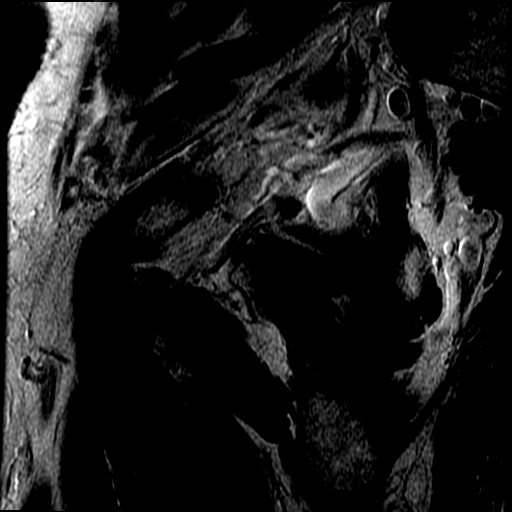

[Series 8: T2 · coronal · 4.0mm · 0.29mm/px · 1 of 21 slices shown (3 of 3)]
[im 1/21]
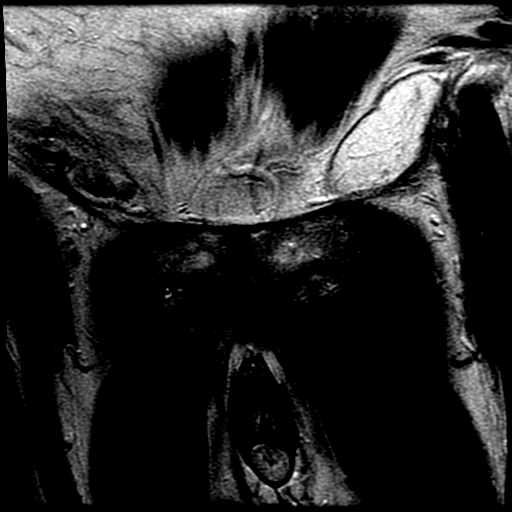

[Series 9: DWI · axial · 3.0mm · 0.59mm/px · 1 of 56 slices shown (1 of 6)]
[im 1/56]
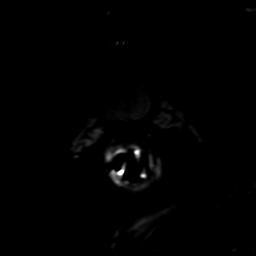

[Series 10: DWI · axial · 3.0mm · 0.59mm/px · 1 of 56 slices shown (2 of 6)]
[im 1/56]
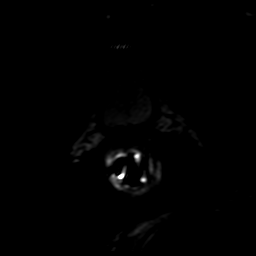

[Series 11: DWI · axial · 3.0mm · 0.59mm/px · 1 of 56 slices shown (3 of 6)]
[im 1/56]
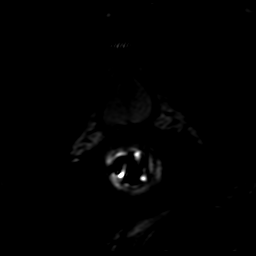

[Series 900: DWI · axial · 3.0mm · 0.59mm/px · 1 of 28 slices shown (4 of 6)]
[im 1/28]
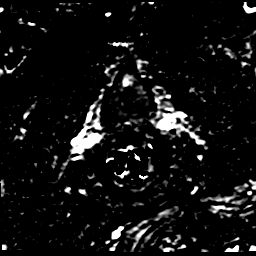

[Series 1000: DWI · axial · 3.0mm · 0.59mm/px · 1 of 28 slices shown (5 of 6)]
[im 1/28]
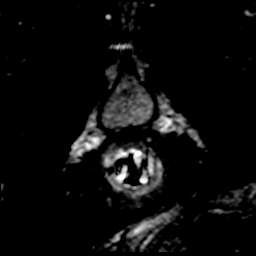

[Series 1100: DWI · axial · 3.0mm · 0.59mm/px · 1 of 28 slices shown (6 of 6)]
[im 1/28]
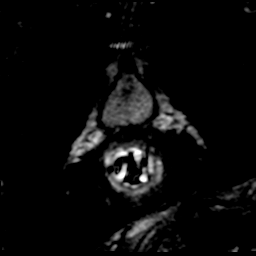

[((id)/(id)/1)-((id)/(id)/1) · axial · 3.0mm · 0.43mm/px · 1 of 71 slices shown (1 of 11)]
[im 1/71]
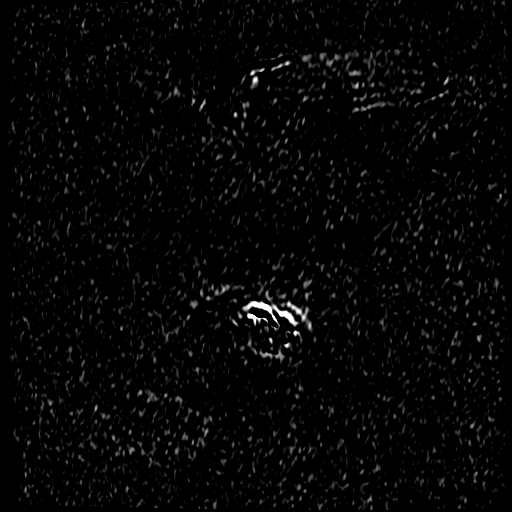

[((id)/(id)/1)-((id)/(id)/1) · axial · 3.0mm · 0.43mm/px · 1 of 72 slices shown (2 of 11)]
[im 1/72]
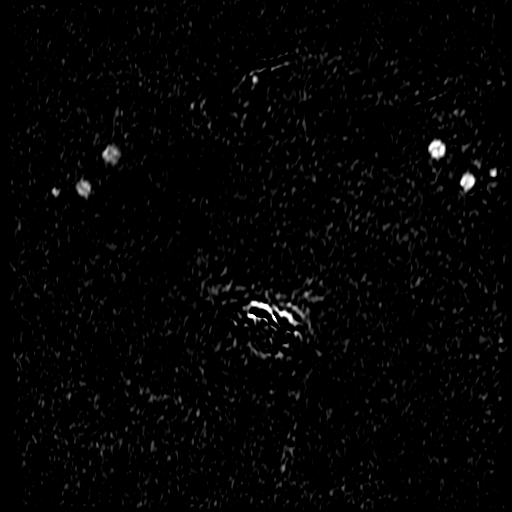

[((id)/(id)/1)-((id)/(id)/1) · axial · 3.0mm · 0.43mm/px · 1 of 72 slices shown (3 of 11)]
[im 1/72]
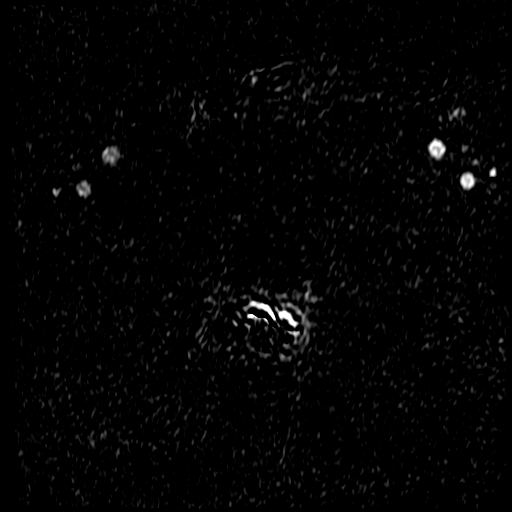

[((id)/(id)/1)-((id)/(id)/1) · axial · 3.0mm · 0.43mm/px · 1 of 72 slices shown (4 of 11)]
[im 1/72]
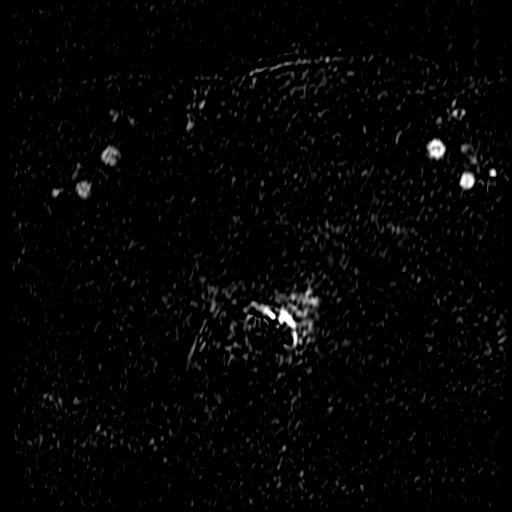

[((id)/(id)/1)-((id)/(id)/1) · axial · 3.0mm · 0.43mm/px · 1 of 72 slices shown (5 of 11)]
[im 1/72]
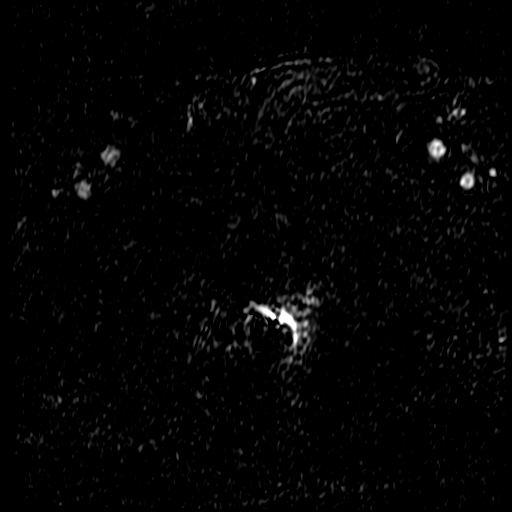

[((id)/(id)/1)-((id)/(id)/1) · axial · 3.0mm · 0.43mm/px · 1 of 72 slices shown (6 of 11)]
[im 1/72]
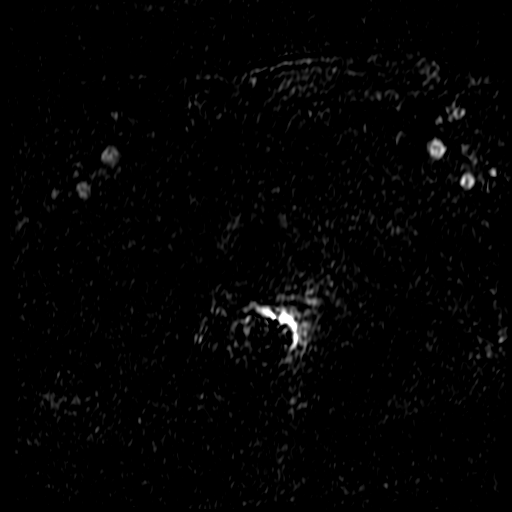

[((id)/(id)/1)-((id)/(id)/1) · axial · 3.0mm · 0.43mm/px · 1 of 72 slices shown (7 of 11)]
[im 1/72]
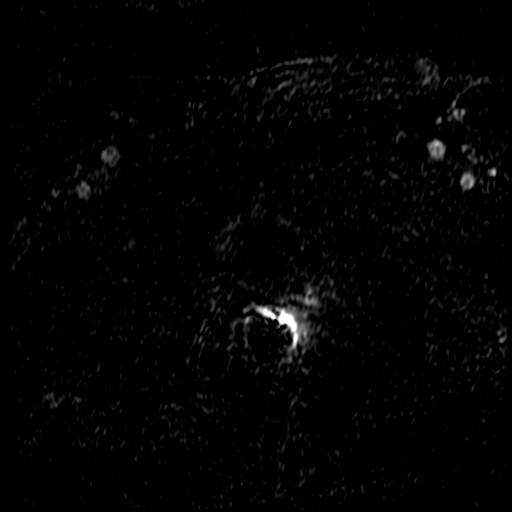

[((id)/(id)/1)-((id)/(id)/1) · axial · 3.0mm · 0.43mm/px · 1 of 72 slices shown (8 of 11)]
[im 1/72]
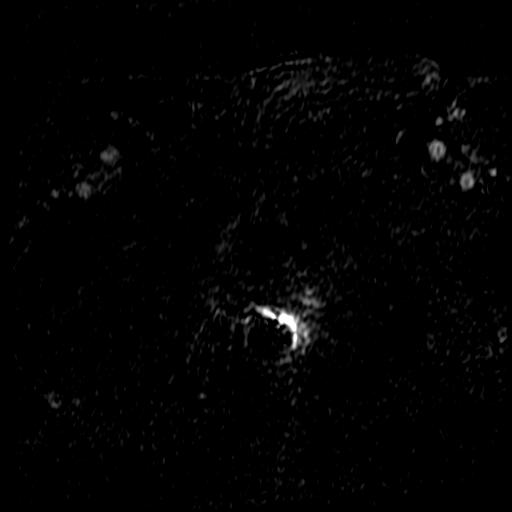

[((id)/(id)/1)-((id)/(id)/1) · axial · 3.0mm · 0.43mm/px · 1 of 72 slices shown (9 of 11)]
[im 1/72]
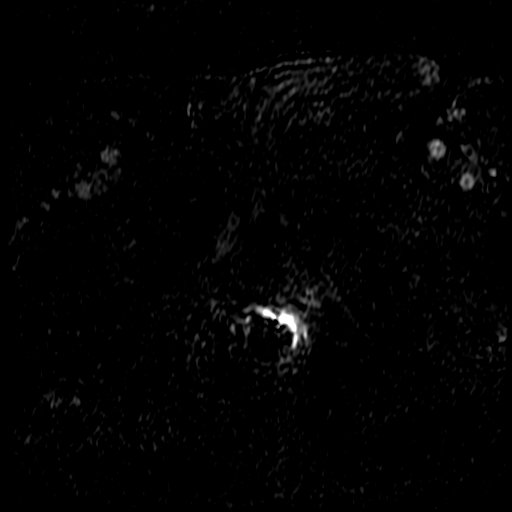

[((id)/(id)/1)-((id)/(id)/1) · axial · 3.0mm · 0.43mm/px · 1 of 72 slices shown (10 of 11)]
[im 1/72]
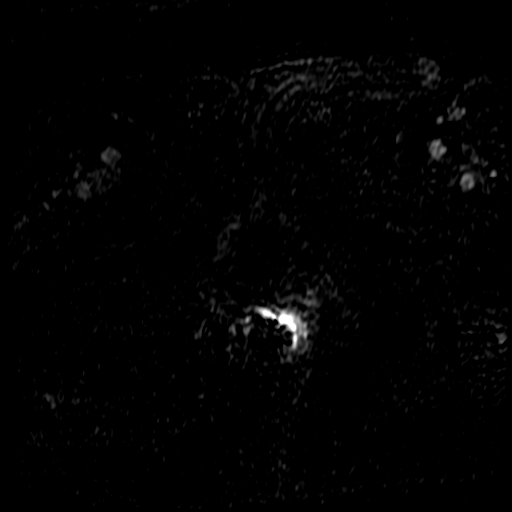

[((id)/(id)/1)-((id)/(id)/1) · axial · 3.0mm · 0.43mm/px · 1 of 72 slices shown (11 of 11)]
[im 1/72]
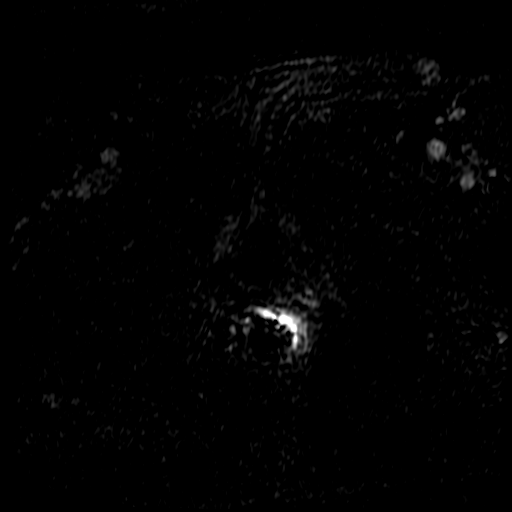

[23 of 53 positions shown; findings below may reference images not displayed]

FINDINGS: Prostate: Moderately enlarged. Central gland enlargement and median
lobe hypertrophy seen with multiple BPH nodules. No suspicious T2
hypointense central gland nodules identified.

The peripheral zone shows an ill-defined wedge-shaped area of T2
hypointensity in the right lateral mid gland, measuring
approximately 9 x 9 mm on image 18/series 5. This shows no evidence
of low ADC signal on diffusion imaging, but does show mild
hyperperfusion on dynamic imaging. These findings are suggestive of
prostatitis, although low-grade carcinoma cannot definitely be
excluded.

There is also a striated appearance of T2 hypointensity seen in the
peripheral zone of the left lateral mid gland, images 16-18/series
5. This shows no evidence of low ADC signal on diffusion imaging.
This does show mild hyperperfusion on dynamic imaging. These
findings are suggestive of prostatitis, although low-grade carcinoma
cannot definitely be excluded.

Transcapsular spread:  Absent

Seminal vesicle involvement: Absent

Neurovascular bundle involvement: Absent

Pelvic adenopathy: Absent

Bone metastasis: Absent

Other findings: Sigmoid diverticulosis, without evidence of
diverticulitis.
IMPRESSION: No evidence of clinically significant macroscopic prostate
carcinoma.

Wedge-shaped and striated T2 hypo intensity in the lateral mid gland
peripheral zone bilaterally, suspicious for prostatitis, although
low-grade carcinoma cannot definitely be excluded. Consider
selective targeting of these areas on repeat biopsy if performed.

## 2018-05-29 IMAGING — DX DG CHEST 2V
2 series · 2 of 2 positions shown · non-contrast
Comparison: Chest x-ray of 07/08/2014

CLINICAL DATA: History of prostate carcinoma

EXAM:
CHEST  2 VIEW

[chest pa]
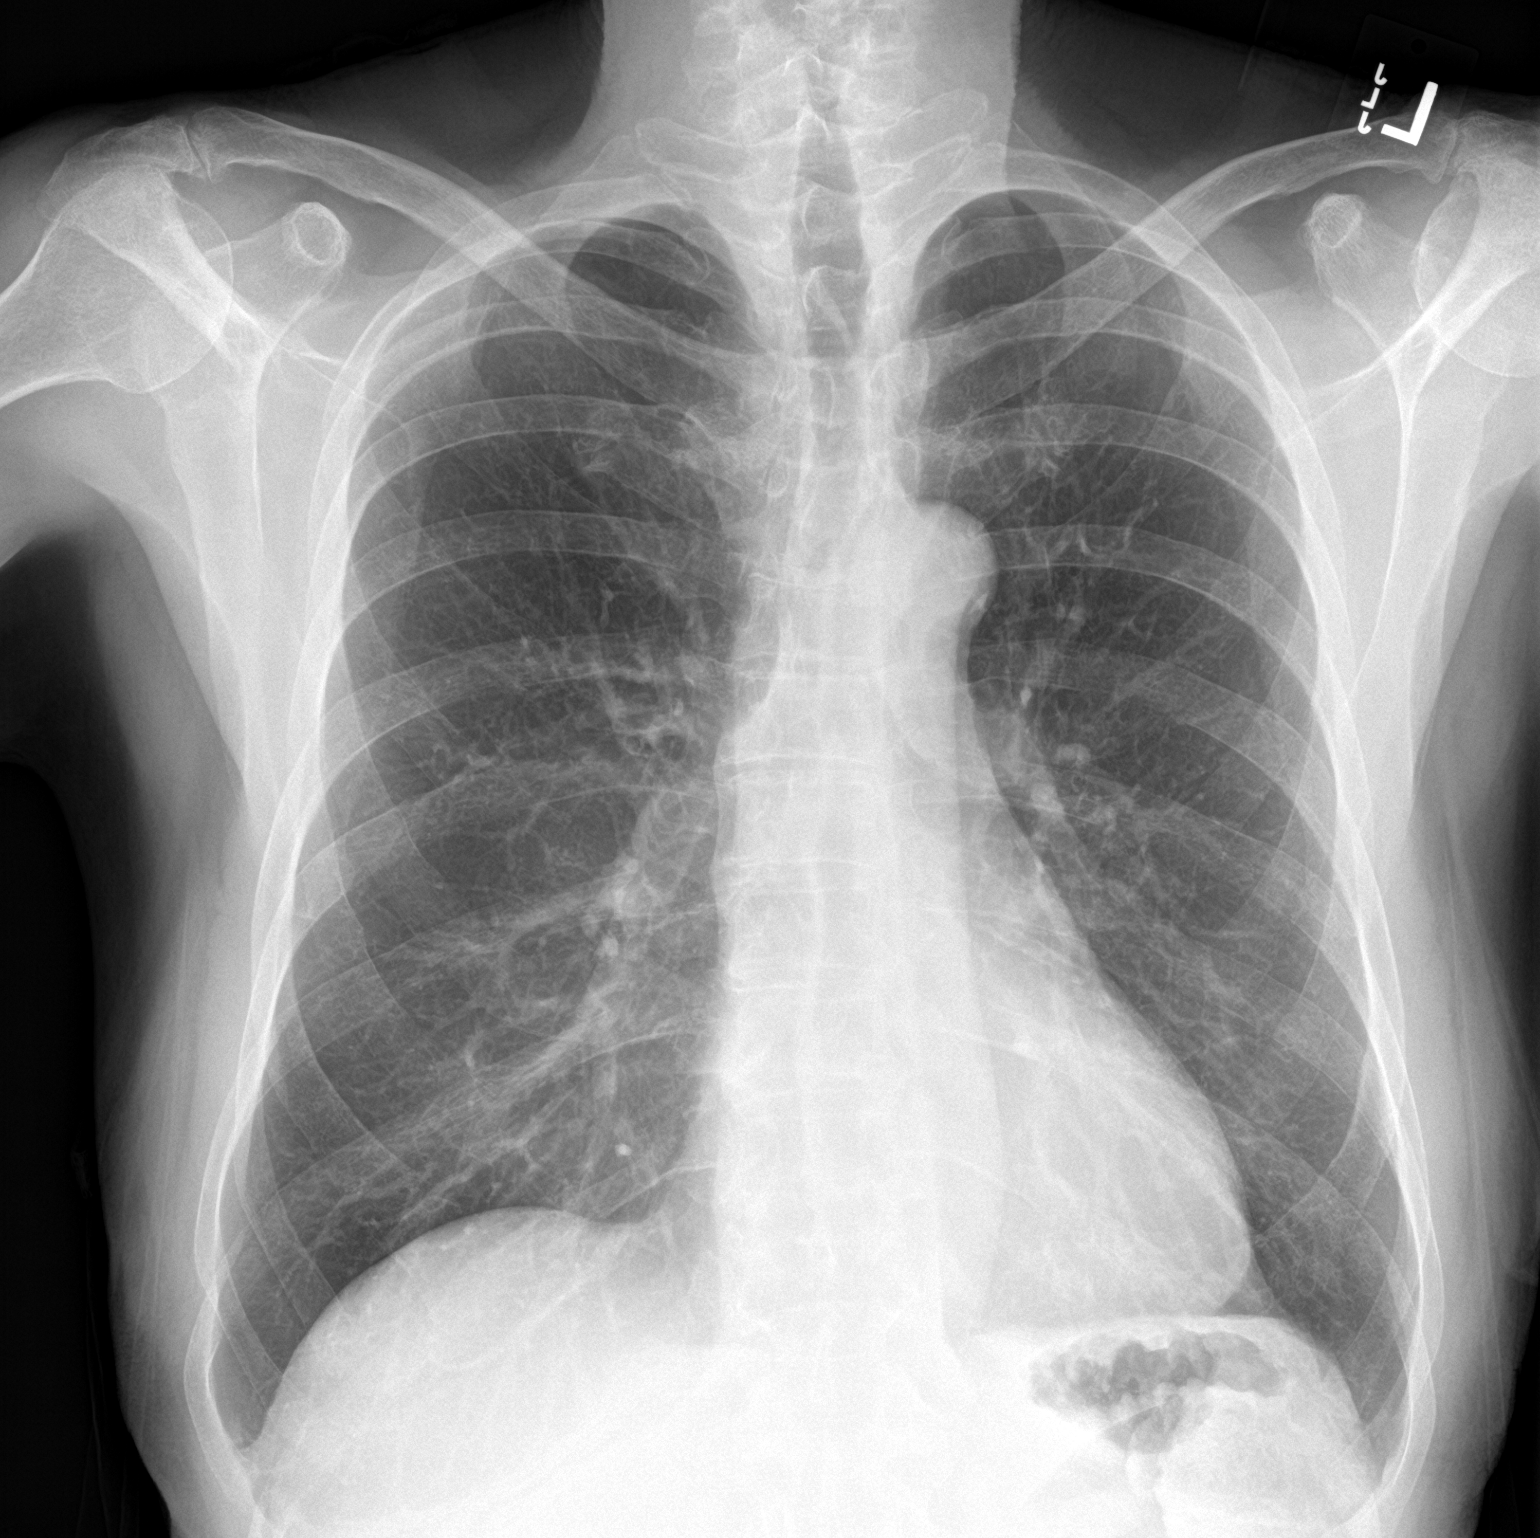

[chest lat]
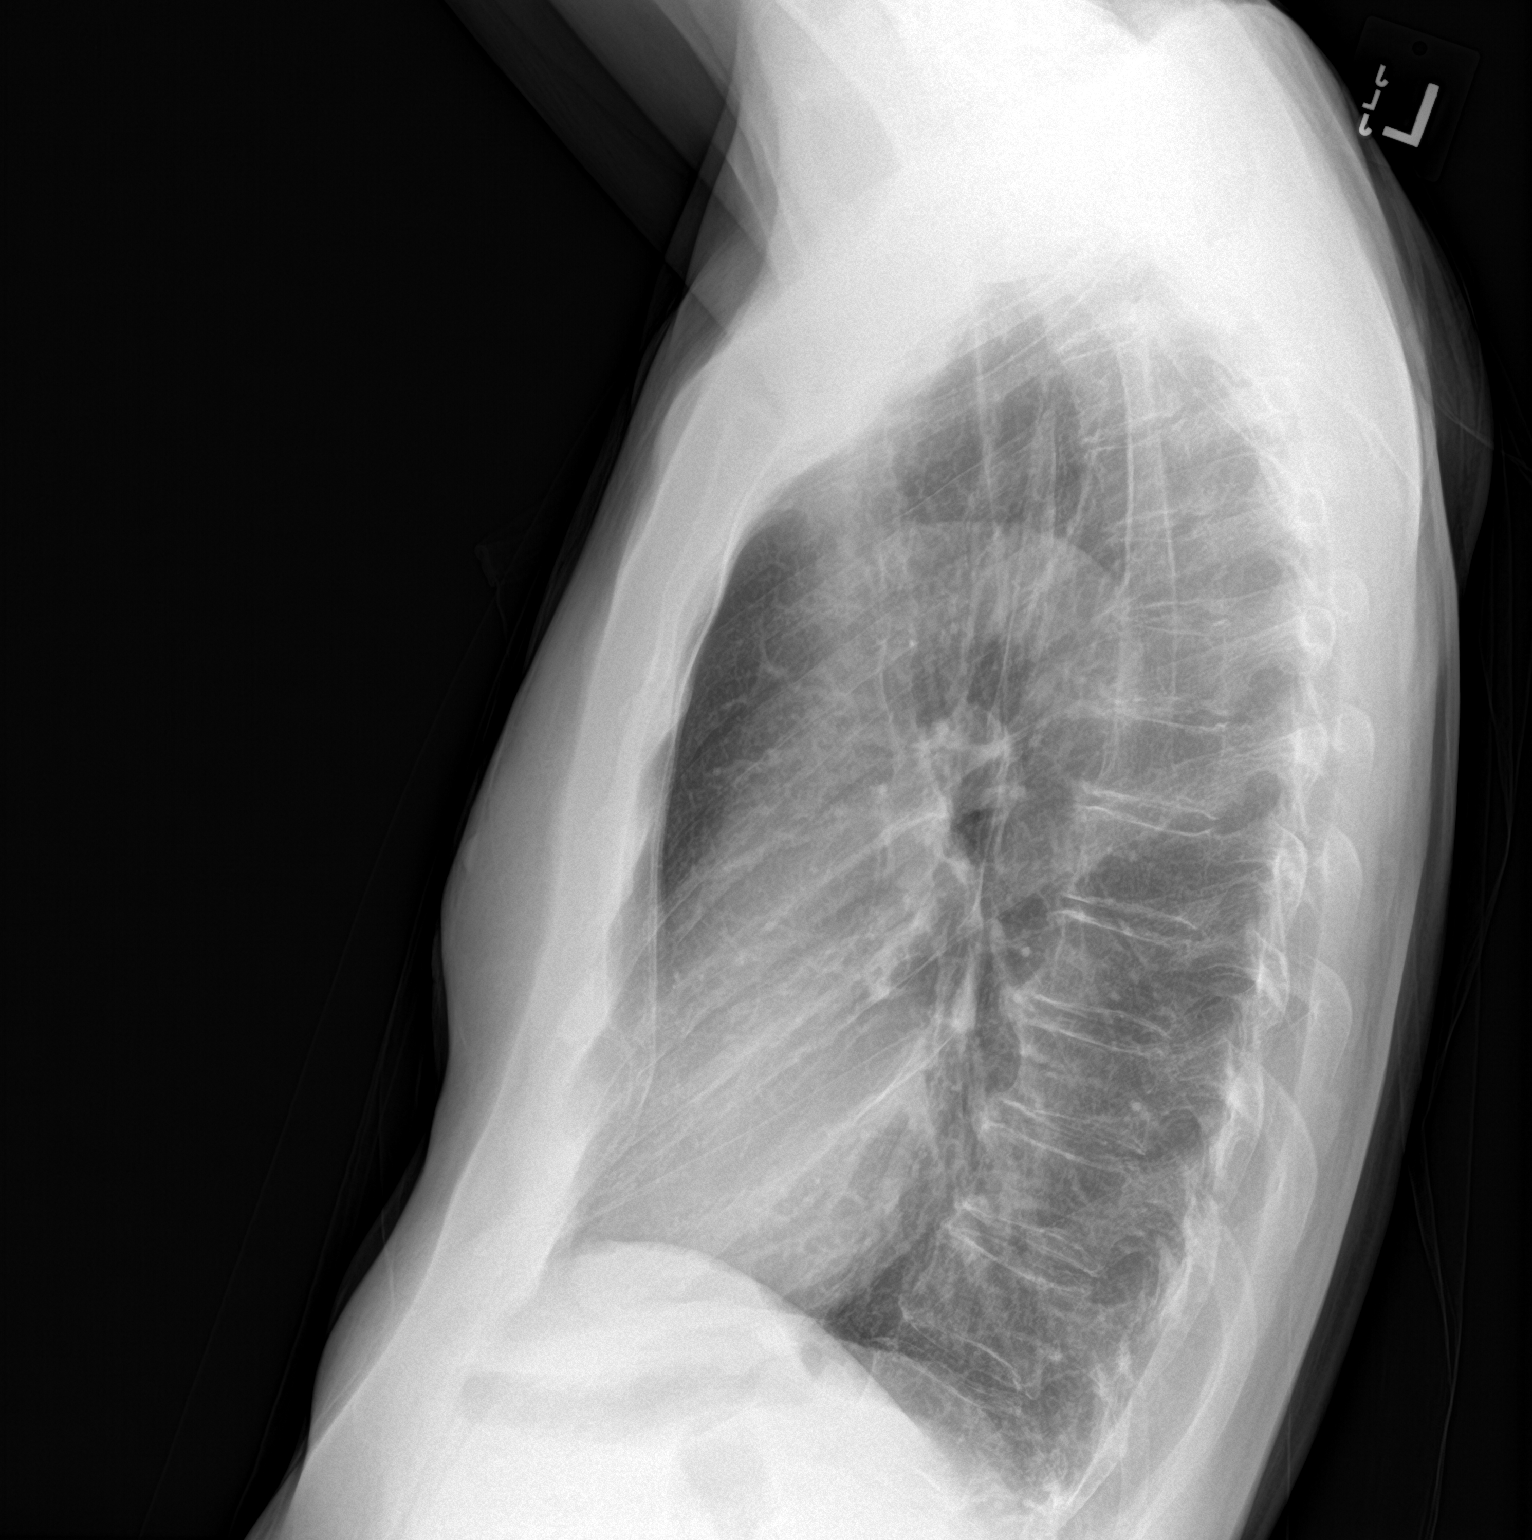

[2 of 2 positions shown; findings below may reference images not displayed]

FINDINGS: No active infiltrate or effusion is seen. Mediastinal and hilar
contours are unremarkable. Heart size is stable. There are mild
degenerative changes in the lower thoracic spine.
IMPRESSION: No active cardiopulmonary disease.

## 2018-06-05 DIAGNOSIS — C61 Malignant neoplasm of prostate: Secondary | ICD-10-CM | POA: Diagnosis not present

## 2018-06-09 DIAGNOSIS — R351 Nocturia: Secondary | ICD-10-CM | POA: Diagnosis not present

## 2018-06-09 DIAGNOSIS — N401 Enlarged prostate with lower urinary tract symptoms: Secondary | ICD-10-CM | POA: Diagnosis not present

## 2018-06-09 DIAGNOSIS — C61 Malignant neoplasm of prostate: Secondary | ICD-10-CM | POA: Diagnosis not present

## 2018-12-26 DIAGNOSIS — H2513 Age-related nuclear cataract, bilateral: Secondary | ICD-10-CM | POA: Diagnosis not present

## 2019-02-02 DIAGNOSIS — C61 Malignant neoplasm of prostate: Secondary | ICD-10-CM | POA: Diagnosis not present

## 2019-02-07 DIAGNOSIS — C61 Malignant neoplasm of prostate: Secondary | ICD-10-CM | POA: Diagnosis not present

## 2019-02-07 DIAGNOSIS — R351 Nocturia: Secondary | ICD-10-CM | POA: Diagnosis not present

## 2019-02-07 DIAGNOSIS — N401 Enlarged prostate with lower urinary tract symptoms: Secondary | ICD-10-CM | POA: Diagnosis not present

## 2019-02-13 DIAGNOSIS — Z23 Encounter for immunization: Secondary | ICD-10-CM | POA: Diagnosis not present

## 2019-02-23 DIAGNOSIS — Z8546 Personal history of malignant neoplasm of prostate: Secondary | ICD-10-CM | POA: Diagnosis not present

## 2019-02-23 DIAGNOSIS — M810 Age-related osteoporosis without current pathological fracture: Secondary | ICD-10-CM | POA: Diagnosis not present

## 2019-02-23 DIAGNOSIS — Z1389 Encounter for screening for other disorder: Secondary | ICD-10-CM | POA: Diagnosis not present

## 2019-02-23 DIAGNOSIS — I10 Essential (primary) hypertension: Secondary | ICD-10-CM | POA: Diagnosis not present

## 2019-02-23 DIAGNOSIS — Z79899 Other long term (current) drug therapy: Secondary | ICD-10-CM | POA: Diagnosis not present

## 2019-02-23 DIAGNOSIS — Z0000 Encounter for general adult medical examination without abnormal findings: Secondary | ICD-10-CM | POA: Diagnosis not present

## 2019-04-20 DIAGNOSIS — Z79899 Other long term (current) drug therapy: Secondary | ICD-10-CM | POA: Diagnosis not present

## 2019-06-25 ENCOUNTER — Ambulatory Visit: Payer: Medicare Other | Attending: Internal Medicine

## 2019-06-25 DIAGNOSIS — Z23 Encounter for immunization: Secondary | ICD-10-CM

## 2019-06-25 NOTE — Progress Notes (Signed)
   Covid-19 Vaccination Clinic  Name:  Andrew Day    MRN: [id number] DOB: Jun 22, 1940  06/25/2019  [name] was observed post Covid-19 immunization for 15 minutes without incident. He was provided with Vaccine Information Sheet and instruction to access the V-Safe system.   [name] was instructed to call 911 with any severe reactions post vaccine: Marland Kitchen Difficulty breathing  . Swelling of face and throat  . A fast heartbeat  . A bad rash all over body  . Dizziness and weakness   Immunizations Administered    Name Date Dose VIS Date Route   Pfizer COVID-19 Vaccine 06/25/2019  9:59 AM 0.3 mL 03/16/2019 Intramuscular   Manufacturer: [place]   Lot: [id number]   [place]: [id number]

## 2019-06-30 ENCOUNTER — Ambulatory Visit: Payer: Medicare Other

## 2019-07-18 ENCOUNTER — Ambulatory Visit: Payer: Medicare Other | Attending: Internal Medicine

## 2019-07-18 DIAGNOSIS — Z23 Encounter for immunization: Secondary | ICD-10-CM

## 2019-07-18 NOTE — Progress Notes (Signed)
   Covid-19 Vaccination Clinic  Name:  Gianny Rupe    MRN: [id number] DOB: 11/27/1940  07/18/2019  Mr. Collister was observed post Covid-19 immunization for 15 minutes without incident. He was provided with Vaccine Information Sheet and instruction to access the V-Safe system.   Mr. Sunshine was instructed to call 911 with any severe reactions post vaccine: Marland Kitchen Difficulty breathing  . Swelling of face and throat  . A fast heartbeat  . A bad rash all over body  . Dizziness and weakness   Immunizations Administered    Name Date Dose VIS Date Route   Pfizer COVID-19 Vaccine 07/18/2019 10:53 AM 0.3 mL 03/16/2019 Intramuscular   Manufacturer: [place]   Lot: [id number]   [place]: [id number]

## 2019-08-01 DIAGNOSIS — C61 Malignant neoplasm of prostate: Secondary | ICD-10-CM | POA: Diagnosis not present

## 2019-08-08 DIAGNOSIS — R351 Nocturia: Secondary | ICD-10-CM | POA: Diagnosis not present

## 2019-08-08 DIAGNOSIS — C61 Malignant neoplasm of prostate: Secondary | ICD-10-CM | POA: Diagnosis not present

## 2019-08-28 DIAGNOSIS — Z79899 Other long term (current) drug therapy: Secondary | ICD-10-CM | POA: Diagnosis not present

## 2019-08-28 DIAGNOSIS — I10 Essential (primary) hypertension: Secondary | ICD-10-CM | POA: Diagnosis not present

## 2019-12-25 DIAGNOSIS — Z23 Encounter for immunization: Secondary | ICD-10-CM | POA: Diagnosis not present

## 2020-02-15 DIAGNOSIS — C61 Malignant neoplasm of prostate: Secondary | ICD-10-CM | POA: Diagnosis not present

## 2020-02-22 DIAGNOSIS — N401 Enlarged prostate with lower urinary tract symptoms: Secondary | ICD-10-CM | POA: Diagnosis not present

## 2020-02-22 DIAGNOSIS — C61 Malignant neoplasm of prostate: Secondary | ICD-10-CM | POA: Diagnosis not present

## 2020-02-22 DIAGNOSIS — R351 Nocturia: Secondary | ICD-10-CM | POA: Diagnosis not present

## 2020-02-26 DIAGNOSIS — H2513 Age-related nuclear cataract, bilateral: Secondary | ICD-10-CM | POA: Diagnosis not present

## 2020-03-13 DIAGNOSIS — Z8546 Personal history of malignant neoplasm of prostate: Secondary | ICD-10-CM | POA: Diagnosis not present

## 2020-03-13 DIAGNOSIS — Z79899 Other long term (current) drug therapy: Secondary | ICD-10-CM | POA: Diagnosis not present

## 2020-03-13 DIAGNOSIS — Z0000 Encounter for general adult medical examination without abnormal findings: Secondary | ICD-10-CM | POA: Diagnosis not present

## 2020-03-13 DIAGNOSIS — Z1389 Encounter for screening for other disorder: Secondary | ICD-10-CM | POA: Diagnosis not present

## 2020-03-13 DIAGNOSIS — M810 Age-related osteoporosis without current pathological fracture: Secondary | ICD-10-CM | POA: Diagnosis not present

## 2020-03-13 DIAGNOSIS — I10 Essential (primary) hypertension: Secondary | ICD-10-CM | POA: Diagnosis not present

## 2020-09-11 DIAGNOSIS — I10 Essential (primary) hypertension: Secondary | ICD-10-CM | POA: Diagnosis not present

## 2020-09-11 DIAGNOSIS — Z79899 Other long term (current) drug therapy: Secondary | ICD-10-CM | POA: Diagnosis not present

## 2020-10-01 DIAGNOSIS — C61 Malignant neoplasm of prostate: Secondary | ICD-10-CM | POA: Diagnosis not present

## 2020-10-08 DIAGNOSIS — R351 Nocturia: Secondary | ICD-10-CM | POA: Diagnosis not present

## 2020-10-08 DIAGNOSIS — C61 Malignant neoplasm of prostate: Secondary | ICD-10-CM | POA: Diagnosis not present

## 2020-10-08 DIAGNOSIS — N401 Enlarged prostate with lower urinary tract symptoms: Secondary | ICD-10-CM | POA: Diagnosis not present

## 2021-02-19 DIAGNOSIS — Z23 Encounter for immunization: Secondary | ICD-10-CM | POA: Diagnosis not present

## 2021-03-03 DIAGNOSIS — H2513 Age-related nuclear cataract, bilateral: Secondary | ICD-10-CM | POA: Diagnosis not present

## 2021-03-20 DIAGNOSIS — I10 Essential (primary) hypertension: Secondary | ICD-10-CM | POA: Diagnosis not present

## 2021-03-20 DIAGNOSIS — Z0000 Encounter for general adult medical examination without abnormal findings: Secondary | ICD-10-CM | POA: Diagnosis not present

## 2021-03-20 DIAGNOSIS — Z79899 Other long term (current) drug therapy: Secondary | ICD-10-CM | POA: Diagnosis not present

## 2021-03-20 DIAGNOSIS — Z136 Encounter for screening for cardiovascular disorders: Secondary | ICD-10-CM | POA: Diagnosis not present

## 2021-03-20 DIAGNOSIS — Z1389 Encounter for screening for other disorder: Secondary | ICD-10-CM | POA: Diagnosis not present

## 2021-05-12 DIAGNOSIS — C61 Malignant neoplasm of prostate: Secondary | ICD-10-CM | POA: Diagnosis not present

## 2021-05-19 DIAGNOSIS — C61 Malignant neoplasm of prostate: Secondary | ICD-10-CM | POA: Diagnosis not present

## 2021-09-25 DIAGNOSIS — I10 Essential (primary) hypertension: Secondary | ICD-10-CM | POA: Diagnosis not present

## 2021-09-25 DIAGNOSIS — Z79899 Other long term (current) drug therapy: Secondary | ICD-10-CM | POA: Diagnosis not present

## 2022-01-18 DIAGNOSIS — Z23 Encounter for immunization: Secondary | ICD-10-CM | POA: Diagnosis not present

## 2022-03-04 DIAGNOSIS — H2513 Age-related nuclear cataract, bilateral: Secondary | ICD-10-CM | POA: Diagnosis not present

## 2022-05-07 DIAGNOSIS — E7800 Pure hypercholesterolemia, unspecified: Secondary | ICD-10-CM | POA: Diagnosis not present

## 2022-05-07 DIAGNOSIS — Z8546 Personal history of malignant neoplasm of prostate: Secondary | ICD-10-CM | POA: Diagnosis not present

## 2022-05-07 DIAGNOSIS — Z23 Encounter for immunization: Secondary | ICD-10-CM | POA: Diagnosis not present

## 2022-05-07 DIAGNOSIS — I10 Essential (primary) hypertension: Secondary | ICD-10-CM | POA: Diagnosis not present

## 2022-05-07 DIAGNOSIS — Z1331 Encounter for screening for depression: Secondary | ICD-10-CM | POA: Diagnosis not present

## 2022-05-07 DIAGNOSIS — M810 Age-related osteoporosis without current pathological fracture: Secondary | ICD-10-CM | POA: Diagnosis not present

## 2022-05-07 DIAGNOSIS — Z0000 Encounter for general adult medical examination without abnormal findings: Secondary | ICD-10-CM | POA: Diagnosis not present

## 2022-05-07 DIAGNOSIS — Z79899 Other long term (current) drug therapy: Secondary | ICD-10-CM | POA: Diagnosis not present

## 2022-05-11 ENCOUNTER — Other Ambulatory Visit: Payer: Self-pay | Admitting: Internal Medicine

## 2022-05-11 DIAGNOSIS — C61 Malignant neoplasm of prostate: Secondary | ICD-10-CM | POA: Diagnosis not present

## 2022-05-11 DIAGNOSIS — M810 Age-related osteoporosis without current pathological fracture: Secondary | ICD-10-CM

## 2022-05-18 DIAGNOSIS — N401 Enlarged prostate with lower urinary tract symptoms: Secondary | ICD-10-CM | POA: Diagnosis not present

## 2022-05-18 DIAGNOSIS — R351 Nocturia: Secondary | ICD-10-CM | POA: Diagnosis not present

## 2022-05-18 DIAGNOSIS — C61 Malignant neoplasm of prostate: Secondary | ICD-10-CM | POA: Diagnosis not present

## 2022-06-17 ENCOUNTER — Other Ambulatory Visit: Payer: Medicare Other

## 2022-06-22 ENCOUNTER — Other Ambulatory Visit: Payer: Medicare Other

## 2022-06-24 ENCOUNTER — Other Ambulatory Visit: Payer: Medicare Other

## 2022-11-08 DIAGNOSIS — I10 Essential (primary) hypertension: Secondary | ICD-10-CM | POA: Diagnosis not present

## 2022-11-08 DIAGNOSIS — M810 Age-related osteoporosis without current pathological fracture: Secondary | ICD-10-CM | POA: Diagnosis not present

## 2022-11-08 DIAGNOSIS — Z8546 Personal history of malignant neoplasm of prostate: Secondary | ICD-10-CM | POA: Diagnosis not present

## 2022-11-23 ENCOUNTER — Ambulatory Visit
Admission: RE | Admit: 2022-11-23 | Discharge: 2022-11-23 | Disposition: A | Discharge status: Home / Self Care | Payer: Medicare Other | Source: Ambulatory Visit | Attending: Internal Medicine | Admitting: Internal Medicine

## 2022-11-23 DIAGNOSIS — N958 Other specified menopausal and perimenopausal disorders: Secondary | ICD-10-CM | POA: Diagnosis not present

## 2022-11-23 DIAGNOSIS — M8588 Other specified disorders of bone density and structure, other site: Secondary | ICD-10-CM | POA: Diagnosis not present

## 2022-11-23 DIAGNOSIS — M810 Age-related osteoporosis without current pathological fracture: Secondary | ICD-10-CM

## 2023-01-13 DIAGNOSIS — Z23 Encounter for immunization: Secondary | ICD-10-CM | POA: Diagnosis not present

## 2023-06-03 DIAGNOSIS — C61 Malignant neoplasm of prostate: Secondary | ICD-10-CM | POA: Diagnosis not present

## 2023-06-03 DIAGNOSIS — I10 Essential (primary) hypertension: Secondary | ICD-10-CM | POA: Diagnosis not present

## 2023-06-03 DIAGNOSIS — Z79899 Other long term (current) drug therapy: Secondary | ICD-10-CM | POA: Diagnosis not present

## 2023-06-03 DIAGNOSIS — Z8546 Personal history of malignant neoplasm of prostate: Secondary | ICD-10-CM | POA: Diagnosis not present

## 2023-06-03 DIAGNOSIS — E7800 Pure hypercholesterolemia, unspecified: Secondary | ICD-10-CM | POA: Diagnosis not present

## 2023-06-03 DIAGNOSIS — M810 Age-related osteoporosis without current pathological fracture: Secondary | ICD-10-CM | POA: Diagnosis not present

## 2023-06-03 DIAGNOSIS — Z0000 Encounter for general adult medical examination without abnormal findings: Secondary | ICD-10-CM | POA: Diagnosis not present

## 2023-06-03 DIAGNOSIS — Z1331 Encounter for screening for depression: Secondary | ICD-10-CM | POA: Diagnosis not present

## 2023-06-06 ENCOUNTER — Other Ambulatory Visit: Payer: Self-pay | Admitting: Internal Medicine

## 2023-06-06 DIAGNOSIS — M810 Age-related osteoporosis without current pathological fracture: Secondary | ICD-10-CM

## 2023-06-10 DIAGNOSIS — C61 Malignant neoplasm of prostate: Secondary | ICD-10-CM | POA: Diagnosis not present

## 2023-07-07 DIAGNOSIS — H524 Presbyopia: Secondary | ICD-10-CM | POA: Diagnosis not present

## 2023-07-07 DIAGNOSIS — H353131 Nonexudative age-related macular degeneration, bilateral, early dry stage: Secondary | ICD-10-CM | POA: Diagnosis not present

## 2023-07-07 DIAGNOSIS — H40013 Open angle with borderline findings, low risk, bilateral: Secondary | ICD-10-CM | POA: Diagnosis not present

## 2023-07-07 DIAGNOSIS — H25813 Combined forms of age-related cataract, bilateral: Secondary | ICD-10-CM | POA: Diagnosis not present

## 2023-07-18 DIAGNOSIS — I10 Essential (primary) hypertension: Secondary | ICD-10-CM | POA: Diagnosis not present

## 2023-07-18 DIAGNOSIS — J069 Acute upper respiratory infection, unspecified: Secondary | ICD-10-CM | POA: Diagnosis not present

## 2023-08-31 DIAGNOSIS — C61 Malignant neoplasm of prostate: Secondary | ICD-10-CM | POA: Diagnosis not present

## 2023-09-14 DIAGNOSIS — C61 Malignant neoplasm of prostate: Secondary | ICD-10-CM | POA: Diagnosis not present

## 2023-11-23 DIAGNOSIS — M810 Age-related osteoporosis without current pathological fracture: Secondary | ICD-10-CM | POA: Diagnosis not present

## 2023-11-23 DIAGNOSIS — Z79899 Other long term (current) drug therapy: Secondary | ICD-10-CM | POA: Diagnosis not present

## 2023-11-23 DIAGNOSIS — I10 Essential (primary) hypertension: Secondary | ICD-10-CM | POA: Diagnosis not present

## 2023-11-23 DIAGNOSIS — E7800 Pure hypercholesterolemia, unspecified: Secondary | ICD-10-CM | POA: Diagnosis not present

## 2023-11-23 DIAGNOSIS — Z8546 Personal history of malignant neoplasm of prostate: Secondary | ICD-10-CM | POA: Diagnosis not present

## 2023-11-30 DIAGNOSIS — H40013 Open angle with borderline findings, low risk, bilateral: Secondary | ICD-10-CM | POA: Diagnosis not present

## 2024-01-19 DIAGNOSIS — Z23 Encounter for immunization: Secondary | ICD-10-CM | POA: Diagnosis not present

## 2024-03-14 DIAGNOSIS — C61 Malignant neoplasm of prostate: Secondary | ICD-10-CM | POA: Diagnosis not present
